# Patient Record
Sex: Female | Born: 1985 | Race: White | Hispanic: No | Marital: Single | Smoking: Never smoker
Health system: Southern US, Community
[De-identification: ages and names within clinical notes are randomized; demographics above are authoritative.]

## PROBLEM LIST (undated history)

## (undated) DIAGNOSIS — Q899 Congenital malformation, unspecified: Secondary | ICD-10-CM

## (undated) DIAGNOSIS — N186 End stage renal disease: Secondary | ICD-10-CM

## (undated) DIAGNOSIS — Z8619 Personal history of other infectious and parasitic diseases: Secondary | ICD-10-CM

## (undated) DIAGNOSIS — I10 Essential (primary) hypertension: Secondary | ICD-10-CM

## (undated) DIAGNOSIS — Z992 Dependence on renal dialysis: Secondary | ICD-10-CM

## (undated) DIAGNOSIS — Z87898 Personal history of other specified conditions: Secondary | ICD-10-CM

## (undated) DIAGNOSIS — Q613 Polycystic kidney, unspecified: Secondary | ICD-10-CM

## (undated) HISTORY — PX: CLEFT PALATE REPAIR: SUR1165

## (undated) HISTORY — DX: Essential (primary) hypertension: I10

## (undated) HISTORY — DX: End stage renal disease: N18.6

## (undated) HISTORY — PX: KIDNEY TRANSPLANT: SHX239

## (undated) HISTORY — DX: Personal history of other specified conditions: Z87.898

## (undated) HISTORY — DX: End stage renal disease: Z99.2

## (undated) HISTORY — DX: Personal history of other infectious and parasitic diseases: Z86.19

## (undated) HISTORY — PX: CENTRAL VENOUS CATHETER INSERTION: SHX401

---

## 2011-05-14 ENCOUNTER — Emergency Department (HOSPITAL_COMMUNITY): Payer: PRIVATE HEALTH INSURANCE

## 2011-05-14 ENCOUNTER — Encounter: Payer: Self-pay | Admitting: *Deleted

## 2011-05-14 ENCOUNTER — Inpatient Hospital Stay (HOSPITAL_COMMUNITY)
Admission: EM | Admit: 2011-05-14 | Discharge: 2011-05-16 | DRG: 438 | Disposition: A | Discharge status: Home / Self Care | Payer: PRIVATE HEALTH INSURANCE | Attending: Internal Medicine | Admitting: Internal Medicine

## 2011-05-14 DIAGNOSIS — I120 Hypertensive chronic kidney disease with stage 5 chronic kidney disease or end stage renal disease: Secondary | ICD-10-CM | POA: Diagnosis present

## 2011-05-14 DIAGNOSIS — K861 Other chronic pancreatitis: Secondary | ICD-10-CM | POA: Diagnosis present

## 2011-05-14 DIAGNOSIS — N186 End stage renal disease: Secondary | ICD-10-CM | POA: Diagnosis present

## 2011-05-14 DIAGNOSIS — Q613 Polycystic kidney, unspecified: Secondary | ICD-10-CM

## 2011-05-14 DIAGNOSIS — D649 Anemia, unspecified: Secondary | ICD-10-CM

## 2011-05-14 DIAGNOSIS — R509 Fever, unspecified: Secondary | ICD-10-CM | POA: Diagnosis present

## 2011-05-14 DIAGNOSIS — Z992 Dependence on renal dialysis: Secondary | ICD-10-CM

## 2011-05-14 DIAGNOSIS — M545 Low back pain, unspecified: Secondary | ICD-10-CM | POA: Diagnosis present

## 2011-05-14 DIAGNOSIS — N179 Acute kidney failure, unspecified: Secondary | ICD-10-CM

## 2011-05-14 DIAGNOSIS — G8929 Other chronic pain: Secondary | ICD-10-CM | POA: Diagnosis present

## 2011-05-14 DIAGNOSIS — M549 Dorsalgia, unspecified: Secondary | ICD-10-CM | POA: Diagnosis present

## 2011-05-14 DIAGNOSIS — D696 Thrombocytopenia, unspecified: Secondary | ICD-10-CM

## 2011-05-14 DIAGNOSIS — Q6119 Other polycystic kidney, infantile type: Secondary | ICD-10-CM

## 2011-05-14 DIAGNOSIS — K859 Acute pancreatitis without necrosis or infection, unspecified: Principal | ICD-10-CM | POA: Diagnosis present

## 2011-05-14 DIAGNOSIS — I10 Essential (primary) hypertension: Secondary | ICD-10-CM | POA: Diagnosis present

## 2011-05-14 HISTORY — DX: Congenital malformation, unspecified: Q89.9

## 2011-05-14 HISTORY — DX: Polycystic kidney, unspecified: Q61.3

## 2011-05-14 LAB — URINE MICROSCOPIC-ADD ON

## 2011-05-14 LAB — COMPREHENSIVE METABOLIC PANEL
AST: 106 U/L — ABNORMAL HIGH (ref 0–37)
Albumin: 2.9 g/dL — ABNORMAL LOW (ref 3.5–5.2)
BUN: 10 mg/dL (ref 6–23)
Calcium: 9.7 mg/dL (ref 8.4–10.5)
Chloride: 107 mEq/L (ref 96–112)
Creatinine, Ser: 4.14 mg/dL — ABNORMAL HIGH (ref 0.50–1.10)
Total Bilirubin: 0.4 mg/dL (ref 0.3–1.2)
Total Protein: 6.2 g/dL (ref 6.0–8.3)

## 2011-05-14 LAB — DIFFERENTIAL
Basophils Absolute: 0 10*3/uL (ref 0.0–0.1)
Basophils Relative: 0 % (ref 0–1)
Eosinophils Absolute: 0.1 10*3/uL (ref 0.0–0.7)
Eosinophils Relative: 1 % (ref 0–5)
Monocytes Absolute: 0.8 10*3/uL (ref 0.1–1.0)
Monocytes Relative: 7 % (ref 3–12)
Neutro Abs: 9.5 10*3/uL — ABNORMAL HIGH (ref 1.7–7.7)

## 2011-05-14 LAB — URINALYSIS, ROUTINE W REFLEX MICROSCOPIC
Bilirubin Urine: NEGATIVE
Leukocytes, UA: NEGATIVE
Nitrite: NEGATIVE
Specific Gravity, Urine: 1.015 (ref 1.005–1.030)
Urobilinogen, UA: 0.2 mg/dL (ref 0.0–1.0)
pH: 7 (ref 5.0–8.0)

## 2011-05-14 LAB — LACTIC ACID, PLASMA: Lactic Acid, Venous: 1.3 mmol/L (ref 0.5–2.2)

## 2011-05-14 LAB — CBC
HCT: 28.7 % — ABNORMAL LOW (ref 36.0–46.0)
Hemoglobin: 9.5 g/dL — ABNORMAL LOW (ref 12.0–15.0)
MCH: 30.3 pg (ref 26.0–34.0)
MCHC: 33.1 g/dL (ref 30.0–36.0)
RDW: 15.5 % (ref 11.5–15.5)

## 2011-05-14 LAB — LIPASE, BLOOD: Lipase: 57 U/L (ref 11–59)

## 2011-05-14 MED ORDER — ACETAMINOPHEN 650 MG RE SUPP: 650.0000 mg | Freq: Four times a day (QID) | RECTAL | Status: DC | PRN

## 2011-05-14 MED ORDER — AMLODIPINE BESYLATE 5 MG PO TABS
10.0000 mg | ORAL_TABLET | Freq: Every day | ORAL | Status: DC
  Administered 2011-05-15 – 2011-05-16 (×2): 10 mg via ORAL
  Filled 2011-05-14: qty 1
  Filled 2011-05-14: qty 2
  Filled 2011-05-14: qty 1

## 2011-05-14 MED ORDER — ACETAMINOPHEN 500 MG PO TABS
1000.0000 mg | ORAL_TABLET | Freq: Once | ORAL | Status: AC
  Administered 2011-05-14: 1000 mg via ORAL
  Filled 2011-05-14: qty 2

## 2011-05-14 MED ORDER — ENOXAPARIN SODIUM 30 MG/0.3ML ~~LOC~~ SOLN
30.0000 mg | SUBCUTANEOUS | Status: DC
  Administered 2011-05-14 – 2011-05-15 (×2): 30 mg via SUBCUTANEOUS
  Filled 2011-05-14 (×2): qty 0.3

## 2011-05-14 MED ORDER — FUROSEMIDE 80 MG PO TABS
80.0000 mg | ORAL_TABLET | Freq: Two times a day (BID) | ORAL | Status: DC
  Administered 2011-05-15 – 2011-05-16 (×3): 80 mg via ORAL
  Filled 2011-05-14 (×4): qty 1

## 2011-05-14 MED ORDER — FENTANYL CITRATE 0.05 MG/ML IJ SOLN
12.5000 ug | INTRAMUSCULAR | Status: DC | PRN
  Filled 2011-05-14: qty 2

## 2011-05-14 MED ORDER — ACETAMINOPHEN 325 MG PO TABS: 650.0000 mg | ORAL_TABLET | Freq: Four times a day (QID) | ORAL | Status: DC | PRN

## 2011-05-14 MED ORDER — ONDANSETRON HCL 4 MG/2ML IJ SOLN: 4.0000 mg | Freq: Four times a day (QID) | INTRAMUSCULAR | Status: DC | PRN

## 2011-05-14 MED ORDER — HYDROCODONE-ACETAMINOPHEN 5-325 MG PO TABS
1.0000 | ORAL_TABLET | ORAL | Status: DC | PRN
  Administered 2011-05-14 – 2011-05-15 (×4): 1 via ORAL
  Filled 2011-05-14 (×4): qty 1

## 2011-05-14 MED ORDER — PANTOPRAZOLE SODIUM 40 MG PO TBEC
40.0000 mg | DELAYED_RELEASE_TABLET | Freq: Every day | ORAL | Status: DC
  Administered 2011-05-14 – 2011-05-15 (×3): 40 mg via ORAL
  Filled 2011-05-14 (×2): qty 1

## 2011-05-14 MED ORDER — SODIUM CHLORIDE 0.9 % IV SOLN
INTRAVENOUS | Status: DC
  Administered 2011-05-14: 19:00:00 via INTRAVENOUS

## 2011-05-14 MED ORDER — ONDANSETRON HCL 4 MG PO TABS: 4.0000 mg | ORAL_TABLET | Freq: Four times a day (QID) | ORAL | Status: DC | PRN

## 2011-05-14 MED ORDER — ALUM & MAG HYDROXIDE-SIMETH 200-200-20 MG/5ML PO SUSP: 30.0000 mL | Freq: Four times a day (QID) | ORAL | Status: DC | PRN

## 2011-05-14 NOTE — ED Provider Notes (Signed)
History     CSN: [id number] Arrival date & time: 05/14/2011  5:17 PM   First MD Initiated Contact with Patient 05/14/11 1730      Chief Complaint  Patient presents with  . Back Pain    HPI Pt was seen at 1735.  Per pt and parents, c/o gradual onset and persistence of return of low back "pain" and "fever" x4 days.  Pt was recently admitted to [facility] for 10 days (discharged on 05/08/11) for same complaint (as well as acute renal failure) and are unsure what the cause was for her symptoms at that time.  States she has been well up until 4 days ago.  Temp on Monday during HD was "low grade," and was given IV vancomycin during that HD treatment.  Pt took a nap today and woke up with "101" fever and low back pain PTA.  Denies CP/SOB, no cough, no rash, no abd pain, no N/V/D.   PMD:  [facility] Renal:  Dr. Kristian Covey Past Medical History  Diagnosis Date  . Renal failure   . Polycystic kidney disease   . Chronic renal failure   . Hemodialysis status   . Anemia   . Congenital abnormalities     Orofacial digital syndrome    Past Surgical History  Procedure Date  . Central venous catheter insertion     left chest  . Cleft palate repair     Social History  . Marital Status: Single   Social History Main Topics  . Smoking status: Never Smoker   . Smokeless tobacco: None  . Alcohol Use: No  . Drug Use: No        Review of Systems ROS: Statement: All systems negative except as marked or noted in the HPI; Constitutional: Positive for fever and chills. ; ; Eyes: Negative for eye pain, redness and discharge. ; ; ENMT: Negative for ear pain, hoarseness, nasal congestion, sinus pressure and sore throat. ; ; Cardiovascular: Negative for chest pain, palpitations, diaphoresis, dyspnea and peripheral edema. ; ; Respiratory: Negative for cough, wheezing and stridor. ; ; Gastrointestinal: Negative for nausea, vomiting, diarrhea and abdominal pain, blood in stool, hematemesis, jaundice  and rectal bleeding. . ; ; Genitourinary: Negative for dysuria, flank pain and hematuria. ; ; Musculoskeletal: +LBP.  Negative for neck pain. Negative for swelling and trauma.; ; Skin: Negative for pruritus, rash, abrasions, blisters, bruising and skin lesion.; ; Neuro: Negative for headache, lightheadedness and neck stiffness. Negative for weakness, altered level of consciousness , altered mental status, extremity weakness, paresthesias, involuntary movement, seizure and syncope.     Allergies  Review of patient's allergies indicates no known allergies.  Home Medications   Current Outpatient Rx  Name Route Sig Dispense Refill  . AMLODIPINE BESYLATE 10 MG PO TABS Oral Take 10 mg by mouth daily.      . FUROSEMIDE 80 MG PO TABS Oral Take 80 mg by mouth 2 (two) times daily.      Marland Kitchen HYDROCODONE-ACETAMINOPHEN 5-500 MG PO TABS Oral Take 1 tablet by mouth as needed. For pain     . OMEPRAZOLE 20 MG PO CPDR Oral Take 20 mg by mouth daily.      Marland Kitchen PROBIOTIC FORMULA PO Oral Take 1 tablet by mouth daily.        BP 139/98  Pulse 127  Temp(Src) 101.7 F (38.7 C) (Rectal)  Resp 20  Ht 5\' 4"  (1.626 m)  Wt 157 lb (71.215 kg)  BMI 26.95 kg/m2  SpO2  100%  LMP 04/23/2011 Patient Vitals for the past 24 hrs:  BP Temp Temp src Pulse Resp SpO2 Height Weight  05/14/11 2044 133/83 mmHg - - 86  20  97 % - -  05/14/11 1836 139/98 mmHg - - 127  - - - -  05/14/11 1835 127/87 mmHg - - 113  - - - -  05/14/11 1833 138/81 mmHg - - 103  - - - -  05/14/11 1753 - 101.7 F (38.7 C) Rectal - - - - -  05/14/11 1746 - 99.8 F (37.7 C) Oral - - - - -  05/14/11 1708 141/95 mmHg 99.6 F (37.6 C) Oral 133  20  100 % 5\' 4"  (1.626 m) 157 lb (71.215 kg)    Physical Exam 1740: Physical examination:  Nursing notes reviewed; Vital signs and O2 SAT reviewed; +febrile.  Constitutional: Well developed, Well nourished, In no acute distress; Head:  Normocephalic, atraumatic; Eyes: EOMI, PERRL, No scleral icterus; ENMT: Mouth and  pharynx normal, Mucous membranes dry; Neck: Supple, Full range of motion, No lymphadenopathy; Cardiovascular: +tachycardia, No murmur, rub, or gallop; Respiratory: Breath sounds clear & equal bilaterally, No rales, rhonchi, wheezes, or rub, Normal respiratory effort/excursion; Chest: Nontender, Movement normal, +HD cath left upper chest; Abdomen: Soft, Nontender, Nondistended, Normal bowel sounds; Genitourinary: +bilat CVA tenderness; Spine:  No midline CS, TS, LS tenderness. +TTP L>R lumbar paraspinal muscles. Extremities: Pulses normal, No tenderness, No edema, No calf edema or asymmetry.; Neuro: AA&Ox3, Major CN grossly intact.  No gross focal motor or sensory deficits in extremities.; Skin: Color normal, Warm, Dry, no rash, no petechiae.    ED Course  Procedures 1745:  Rectal temp 101.7.  Will dose tylenol.  Unit Secretary to obtain previous admit records from [place].   MDM  MDM Reviewed: nursing note, vitals and previous chart Reviewed previous: labs and CT scan Interpretation: labs and x-ray   Results for orders placed during the hospital encounter of 05/14/11  CBC      Component Value Range   WBC 11.5 (*) 4.0 - 10.5 (K/uL)   RBC 3.14 (*) 3.87 - 5.11 (MIL/uL)   Hemoglobin 9.5 (*) 12.0 - 15.0 (g/dL)   HCT 40.9 (*) 81.1 - 46.0 (%)   MCV 91.4  78.0 - 100.0 (fL)   MCH 30.3  26.0 - 34.0 (pg)   MCHC 33.1  30.0 - 36.0 (g/dL)   RDW 91.4  78.2 - 95.6 (%)   Platelets 254  150 - 400 (K/uL)  DIFFERENTIAL      Component Value Range   Neutrophils Relative 83 (*) 43 - 77 (%)   Neutro Abs 9.5 (*) 1.7 - 7.7 (K/uL)   Lymphocytes Relative 9 (*) 12 - 46 (%)   Lymphs Abs 1.0  0.7 - 4.0 (K/uL)   Monocytes Relative 7  3 - 12 (%)   Monocytes Absolute 0.8  0.1 - 1.0 (K/uL)   Eosinophils Relative 1  0 - 5 (%)   Eosinophils Absolute 0.1  0.0 - 0.7 (K/uL)   Basophils Relative 0  0 - 1 (%)   Basophils Absolute 0.0  0.0 - 0.1 (K/uL)  COMPREHENSIVE METABOLIC PANEL      Component Value Range    Sodium 142  135 - 145 (mEq/L)   Potassium 4.7  3.5 - 5.1 (mEq/L)   Chloride 107  96 - 112 (mEq/L)   CO2 27  19 - 32 (mEq/L)   Glucose, Bld 95  70 - 99 (mg/dL)   BUN 10  6 - 23 (mg/dL)   Creatinine, Ser 0.45 (*) 0.50 - 1.10 (mg/dL)   Calcium 9.7  8.4 - 40.9 (mg/dL)   Total Protein 6.2  6.0 - 8.3 (g/dL)   Albumin 2.9 (*) 3.5 - 5.2 (g/dL)   AST 811 (*) 0 - 37 (U/L)   ALT 59 (*) 0 - 35 (U/L)   Alkaline Phosphatase 127 (*) 39 - 117 (U/L)   Total Bilirubin 0.4  0.3 - 1.2 (mg/dL)   GFR calc non Af Amer 14 (*) >90 (mL/min)   GFR calc Af Amer 16 (*) >90 (mL/min)  LACTIC ACID, PLASMA      Component Value Range   Lactic Acid, Venous 1.3  0.5 - 2.2 (mmol/L)  LIPASE, BLOOD      Component Value Range   Lipase 57  11 - 59 (U/L)  URINALYSIS, ROUTINE W REFLEX MICROSCOPIC      Component Value Range   Color, Urine YELLOW  YELLOW    Appearance CLEAR  CLEAR    Specific Gravity, Urine 1.015  1.005 - 1.030    pH 7.0  5.0 - 8.0    Glucose, UA 100 (*) NEGATIVE (mg/dL)   Hgb urine dipstick SMALL (*) NEGATIVE    Bilirubin Urine NEGATIVE  NEGATIVE    Ketones, ur NEGATIVE  NEGATIVE (mg/dL)   Protein, ur TRACE (*) NEGATIVE (mg/dL)   Urobilinogen, UA 0.2  0.0 - 1.0 (mg/dL)   Nitrite NEGATIVE  NEGATIVE    Leukocytes, UA NEGATIVE  NEGATIVE   URINE MICROSCOPIC-ADD ON      Component Value Range   Squamous Epithelial / LPF FEW (*) RARE    WBC, UA 3-6  <3 (WBC/hpf)   RBC / HPF 3-6  <3 (RBC/hpf)   Bacteria, UA RARE  RARE    Dg Chest 2 View  05/14/2011  *RADIOLOGY REPORT*  Clinical Data: Fever.  CHEST - 2 VIEW  Comparison: None.  Findings: Left dialysis catheter is in place.  The distal tip was directed laterally into the lateral wall of the upper SVC.  Heart is normal size.  Lungs are clear.  No confluent opacity or effusion.  No acute bony abnormality.  IMPRESSION: No active cardiopulmonary disease.  Original Report Authenticated By: Cyndie Chime, M.D.   7:54 PM:  No previous records/labs in [place].   [place] records obtained.  LFT's elevated today; previous LFT's on 04/24/11 at [facility] with AST 6, ALT 8, alkphos 39.  Cr elevated per known hx of CRF on HD.  H/H with known anemia; previous labs on 04/29/11 at [facility] H/H 8.3, 24.3.  No clear cut source of fever at this time.  UC and BC x2 pending.  Will check CT A/P without contrast d/t renal failure.     Ct Abdomen Pelvis Wo Contrast  05/14/2011  *RADIOLOGY REPORT*  Clinical Data: Back pain, fever.  CT ABDOMEN AND PELVIS WITHOUT CONTRAST  Technique:  Multidetector CT imaging of the abdomen and pelvis was performed following the standard protocol without intravenous contrast.  Comparison: 04/21/2011  Findings: Dependent atelectasis in the lung bases.  Trace pleural effusions.  Heart is upper limits normal in size.  Periportal areas of low density again noted as seen on prior study, possibly periportal edema.  This could also represent intrahepatic biliary ductal dilatation, but it is difficult to determine without IV contrast.  Spleen is upper limits normal in size.  No definite focal lesion.  Pancreatic calcifications are noted compatible with chronic pancreatitis.  There is a small  amount of stranding adjacent to the pancreatic tail and extending into the left anterior pararenal space.  There is also stranding in the right anterior pararenal space.  Findings may reflect early or mild changes of acute pancreatitis.  Recommend clinical correlation. Small amount of free fluid is seen in the pelvis.  Uterus and adnexa are unremarkable.  Large and small bowel grossly unremarkable.  No acute bony abnormality.  IMPRESSION: Changes of chronic pancreatitis with calcifications present.  There is stranding near the pancreas and extending in the anterior pararenal spaces bilaterally suggesting the possibility of acute pancreatitis.  Question periportal edema versus intrahepatic biliary ductal dilatation.  It is difficult to differentiate without IV contrast.   Bibasilar atelectasis, trace effusions.  Original Report Authenticated By: Cyndie Chime, M.D.   9:07 PM:  Pt's previous CT A/P without contrast on 04/21/11 per [facility] records similar to above, though today's CT with new stranding near the pancreas raising the possibility of acute on chronic pancreatitis.  Abd continues benign on exam, only c/o back pain and "I'm hungry."  No N/V while in ED, VSS, tachycardia improving as fever improves.  Pain improved after meds.  Dx testing d/w pt and family.  Questions answered.  Verb understanding, agreeable to admit.   9:26 PM:  T/C to [facility] Dr. Adela Glimpse, case discussed, including:  HPI, pertinent PM/SHx, VS/PE, dx testing, ED course and treatment.  Agreeable to admit.  Will eval pt in ED.     [facility]    Laray Anger, DO 05/15/11 2107

## 2011-05-14 NOTE — ED Notes (Signed)
Family at bedside. Patient is uncomfortable and stating she is cold. Patients temp is 101.7 so only a sheet was given. Patient was taken to X-ray.

## 2011-05-14 NOTE — H&P (Signed)
Monica Guerra MRN: [id number] DOB/AGE: 1986-03-12 25 y.o. Primary Care Physician:GOLDING,JOHN CABOT, MD Admit date: 05/14/2011 Chief Complaint: This is a 25 year old female who presents to the ER with a chief complaint of fever and chills patient was found to have a temperature of 101.7 in the ER. The patient was recently admitted and [facility] between the 10th and October 20 with acute renal failure. Her acute renal failure with a presenting creatinine of 8.78 was thought to be secondary to polycystic kidney disease wasn't diabetic nephropathy. The patient was initiated on hemodialysis on Monday Wednesday Friday via a dialysis catheter. The patient reports that she has had chronic low back pain for the last 3 months. Over the course of the last week or so the pain has been particularly worse. She states that she received a dose of vancomycin with her hemodialysis session on Monday. After receiving the vancomycin dose she developed some diarrhea and GI distress. She's been having diarrhea 5-6 episodes for the last 3-4 days. She denies any fever chills riders at home and does complain of some night sweats. She is being admitted for fever possibility of a psoas abscess versus discitis versus epidural abscess. HPI:   Past Medical History  Diagnosis Date  . Renal failure   . Polycystic kidney disease   . Chronic renal failure   . Hemodialysis status   . Anemia   . Congenital abnormalities     Orofacial digital syndrome    Past Surgical History  Procedure Date  . Central venous catheter insertion     left chest  . Cleft palate repair     Prior to Admission medications   Medication Sig Start Date End Date Taking? Authorizing Provider  amLODipine (NORVASC) 10 MG tablet Take 10 mg by mouth daily.     Yes Historical Provider, MD  furosemide (LASIX) 80 MG tablet Take 80 mg by mouth 2 (two) times daily.     Yes Historical Provider, MD  HYDROcodone-acetaminophen (VICODIN) 5-500 MG  per tablet Take 1 tablet by mouth as needed. For pain    Yes Historical Provider, MD  omeprazole (PRILOSEC) 20 MG capsule Take 20 mg by mouth daily.     Yes Historical Provider, MD  Probiotic Product (PROBIOTIC FORMULA PO) Take 1 tablet by mouth daily.     Yes Historical Provider, MD    Allergies: No Known Allergies  History reviewed. No pertinent family history.  Social History:  reports that she has never smoked. She does not have any smokeless tobacco history on file. She reports that she does not drink alcohol or use illicit drugs.  ROS: A 14 point review of systems was done and was found to be positive for low back pain some nausea and diarrhea for the last one week.  PHYSICAL EXAM: Blood pressure 133/83, pulse 86, temperature 99.2 F (37.3 C), temperature source Oral, resp. rate 20, height 5\' 4"  (1.626 m), weight 71.215 kg (157 lb), last menstrual period 04/23/2011, SpO2 97.00%. MAXIMUM TEMPERATURE of 101.7   HEENT. Sclerae anicteric oral mucosa is dry, neck is supple no JVD Lungs. Clear to auscultation bilaterally no wheezes no crackles or rhonchi Cardiovascular. Regular rate and rhythm no murmurs rubs or gallops Abdomen soft nontender nondistended, normoactive bowel sounds, nontender to palpation Skin. Without any skin rashes Neurologic. No gross focal neurologic deficits, cranial nerves 2-12 grossly intact, no gross motor or sensory deficits found Extremities, trace pedal edema in bilateral lower extremities, she also has some bilateral upper extremity puffiness Psychiatric,  appropriate mood and affect    EKG: normal EKG, normal sinus rhythm, unchanged from previous tracings, there are no previous tracings available for comparison.  No results found for this or any previous visit (from the past 240 hour(s)).   Results for orders placed during the hospital encounter of 05/14/11 (from the past 48 hour(s))  CBC     Status: Abnormal   Collection Time   05/14/11  6:30 PM       Component Value Range Comment   WBC 11.5 (*) 4.0 - 10.5 (K/uL)    RBC 3.14 (*) 3.87 - 5.11 (MIL/uL)    Hemoglobin 9.5 (*) 12.0 - 15.0 (g/dL)    HCT 24.4 (*) 01.0 - 46.0 (%)    MCV 91.4  78.0 - 100.0 (fL)    MCH 30.3  26.0 - 34.0 (pg)    MCHC 33.1  30.0 - 36.0 (g/dL)    RDW 27.2  53.6 - 64.4 (%)    Platelets 254  150 - 400 (K/uL)   DIFFERENTIAL     Status: Abnormal   Collection Time   05/14/11  6:30 PM      Component Value Range Comment   Neutrophils Relative 83 (*) 43 - 77 (%)    Neutro Abs 9.5 (*) 1.7 - 7.7 (K/uL)    Lymphocytes Relative 9 (*) 12 - 46 (%)    Lymphs Abs 1.0  0.7 - 4.0 (K/uL)    Monocytes Relative 7  3 - 12 (%)    Monocytes Absolute 0.8  0.1 - 1.0 (K/uL)    Eosinophils Relative 1  0 - 5 (%)    Eosinophils Absolute 0.1  0.0 - 0.7 (K/uL)    Basophils Relative 0  0 - 1 (%)    Basophils Absolute 0.0  0.0 - 0.1 (K/uL)   COMPREHENSIVE METABOLIC PANEL     Status: Abnormal   Collection Time   05/14/11  6:30 PM      Component Value Range Comment   Sodium 142  135 - 145 (mEq/L)    Potassium 4.7  3.5 - 5.1 (mEq/L)    Chloride 107  96 - 112 (mEq/L)    CO2 27  19 - 32 (mEq/L)    Glucose, Bld 95  70 - 99 (mg/dL)    BUN 10  6 - 23 (mg/dL)    Creatinine, Ser 0.34 (*) 0.50 - 1.10 (mg/dL)    Calcium 9.7  8.4 - 10.5 (mg/dL)    Total Protein 6.2  6.0 - 8.3 (g/dL)    Albumin 2.9 (*) 3.5 - 5.2 (g/dL)    AST 742 (*) 0 - 37 (U/L) SLIGHT HEMOLYSIS   ALT 59 (*) 0 - 35 (U/L)    Alkaline Phosphatase 127 (*) 39 - 117 (U/L)    Total Bilirubin 0.4  0.3 - 1.2 (mg/dL)    GFR calc non Af Amer 14 (*) >90 (mL/min)    GFR calc Af Amer 16 (*) >90 (mL/min)   LACTIC ACID, PLASMA     Status: Normal   Collection Time   05/14/11  6:30 PM      Component Value Range Comment   Lactic Acid, Venous 1.3  0.5 - 2.2 (mmol/L)   LIPASE, BLOOD     Status: Normal   Collection Time   05/14/11  6:30 PM      Component Value Range Comment   Lipase 57  11 - 59 (U/L)   URINALYSIS, ROUTINE W REFLEX MICROSCOPIC  Status: Abnormal   Collection Time   05/14/11  6:43 PM      Component Value Range Comment   Color, Urine YELLOW  YELLOW     Appearance CLEAR  CLEAR     Specific Gravity, Urine 1.015  1.005 - 1.030     pH 7.0  5.0 - 8.0     Glucose, UA 100 (*) NEGATIVE (mg/dL)    Hgb urine dipstick SMALL (*) NEGATIVE     Bilirubin Urine NEGATIVE  NEGATIVE     Ketones, ur NEGATIVE  NEGATIVE (mg/dL)    Protein, ur TRACE (*) NEGATIVE (mg/dL)    Urobilinogen, UA 0.2  0.0 - 1.0 (mg/dL)    Nitrite NEGATIVE  NEGATIVE     Leukocytes, UA NEGATIVE  NEGATIVE    URINE MICROSCOPIC-ADD ON     Status: Abnormal   Collection Time   05/14/11  6:43 PM      Component Value Range Comment   Squamous Epithelial / LPF FEW (*) RARE     WBC, UA 3-6  <3 (WBC/hpf)    RBC / HPF 3-6  <3 (RBC/hpf)    Bacteria, UA RARE  RARE      Ct Abdomen Pelvis Wo Contrast  05/14/2011   IMPRESSION: Changes of chronic pancreatitis with calcifications present.  There is stranding near the pancreas and extending in the anterior pararenal spaces bilaterally suggesting the possibility of acute pancreatitis.  Question periportal edema versus intrahepatic biliary ductal dilatation.  It is difficult to differentiate without IV contrast.  Bibasilar atelectasis, trace effusions.  Original Report Authenticated By: Cyndie Chime, M.D.   Dg Chest 2 View  05/14/2011  *RADIOLOGY REPORT*  Clinical Data: Fever.  CHEST - 2 VIEW  Comparison: None.  Findings: Left dialysis catheter is in place.  The distal tip was directed laterally into the lateral wall of the upper SVC.  Heart is normal size.  Lungs are clear.  No confluent opacity or effusion.  No acute bony abnormality.  IMPRESSION: No active cardiopulmonary disease.  Original Report Authenticated By: Cyndie Chime, M.D.    Impression:  Principal Problem:  #1 Back pain, acute on chronic: This appears to be chronic and has been present for the last 3 months, however in the setting of for fever, worsening of  pain in the last week or so, as well as abscess versus discitis versus epidural abscess needs to be ruled out. The patient would need an MRI of the lumbar spine without contrast in the morning. We will empirically cover her with vancomycin, no blood cultures    #2 Renal failure, acute on chronic: Patient is currently receiving hemodialysis on Monday Wednesday Friday via a left subclavian hemodialysis catheter. The catheter site appears to be within normal limits without any evidence of erythema or induration. I do not feel that the catheter is a source of infection  . Nephrology will be consulted in the morning.  #3 Fever and chills: A urinalysis and chest x-ray are negative. Blood cultures have been drawn in the ER. Because of concern about the possibility of an epidural abscess and back pain, the patient has been initiated on vancomycin.  #4 patient has anemia of chronic disease secondary to her renal failure. During her previous hospitalization she developed thrombocytopenia. A hematology consultation was obtained for her Thrombocytopenia. Dr. Isabel Caprice  saw the patient [facility]. Her thrombocytopenia was attributed to Ultram. Most of her episodes of heart to be drug related.  CKD (chronic kidney disease),  stage V  Polycystic kidney disease, congenital  HTN (hypertension): The patient is on Norvasc which will be continued.  The plan of care was discussed with the patient and her mother is currently present at the bedside.         Monica Guerra 05/14/2011, 10:59 PM

## 2011-05-14 NOTE — ED Notes (Signed)
rn in to recheck temp, pt reports she just drank a sip of juice, pt advised no to drink and temp will be taken in a few min

## 2011-05-14 NOTE — ED Notes (Signed)
Patient in a lot of pain right now.

## 2011-05-14 NOTE — ED Notes (Signed)
Pt states she was admitted to [facility] for aprox 10 days and discharged on October 27th. Pt states she has had a fever and severe back pain since. Pt states she had dialysis this am.

## 2011-05-15 ENCOUNTER — Inpatient Hospital Stay (HOSPITAL_COMMUNITY): Payer: PRIVATE HEALTH INSURANCE

## 2011-05-15 ENCOUNTER — Ambulatory Visit (HOSPITAL_COMMUNITY): Payer: PRIVATE HEALTH INSURANCE

## 2011-05-15 DIAGNOSIS — R509 Fever, unspecified: Secondary | ICD-10-CM | POA: Insufficient documentation

## 2011-05-15 DIAGNOSIS — Q619 Cystic kidney disease, unspecified: Secondary | ICD-10-CM | POA: Insufficient documentation

## 2011-05-15 DIAGNOSIS — M549 Dorsalgia, unspecified: Secondary | ICD-10-CM | POA: Insufficient documentation

## 2011-05-15 DIAGNOSIS — K861 Other chronic pancreatitis: Secondary | ICD-10-CM | POA: Diagnosis present

## 2011-05-15 LAB — COMPREHENSIVE METABOLIC PANEL
ALT: 51 U/L — ABNORMAL HIGH (ref 0–35)
AST: 74 U/L — ABNORMAL HIGH (ref 0–37)
Alkaline Phosphatase: 100 U/L (ref 39–117)
CO2: 26 mEq/L (ref 19–32)
Chloride: 106 mEq/L (ref 96–112)
GFR calc non Af Amer: 10 mL/min — ABNORMAL LOW (ref >90)
Potassium: 4.5 mEq/L (ref 3.5–5.1)
Sodium: 139 mEq/L (ref 135–145)
Total Bilirubin: 0.5 mg/dL (ref 0.3–1.2)

## 2011-05-15 LAB — C-REACTIVE PROTEIN: CRP: 5.17 mg/dL — ABNORMAL HIGH (ref <0.60)

## 2011-05-15 LAB — HEMOGLOBIN A1C: Hgb A1c MFr Bld: 5.2 % (ref <5.7)

## 2011-05-15 LAB — URINE CULTURE: Culture  Setup Time: 201211030050

## 2011-05-15 LAB — CBC
HCT: 25.2 % — ABNORMAL LOW (ref 36.0–46.0)
MCHC: 32.5 g/dL (ref 30.0–36.0)
MCV: 92.3 fL (ref 78.0–100.0)
RDW: 15.7 % — ABNORMAL HIGH (ref 11.5–15.5)

## 2011-05-15 LAB — URINALYSIS, ROUTINE W REFLEX MICROSCOPIC
Glucose, UA: 100 mg/dL — AB
Ketones, ur: NEGATIVE mg/dL
pH: 8 (ref 5.0–8.0)

## 2011-05-15 LAB — URINE MICROSCOPIC-ADD ON

## 2011-05-15 LAB — MRSA PCR SCREENING: MRSA by PCR: NEGATIVE

## 2011-05-15 LAB — SEDIMENTATION RATE: Sed Rate: 39 mm/hr — ABNORMAL HIGH (ref 0–22)

## 2011-05-15 LAB — CLOSTRIDIUM DIFFICILE BY PCR: Toxigenic C. Difficile by PCR: NEGATIVE

## 2011-05-15 MED ORDER — DIPHENHYDRAMINE HCL 25 MG PO CAPS
25.0000 mg | ORAL_CAPSULE | Freq: Four times a day (QID) | ORAL | Status: DC | PRN
  Administered 2011-05-15: 25 mg via ORAL
  Filled 2011-05-15: qty 1

## 2011-05-15 MED ORDER — VANCOMYCIN HCL IN DEXTROSE 1-5 GM/200ML-% IV SOLN
INTRAVENOUS | Status: AC
  Filled 2011-05-15: qty 200

## 2011-05-15 MED ORDER — VANCOMYCIN HCL IN DEXTROSE 1-5 GM/200ML-% IV SOLN
1000.0000 mg | Freq: Once | INTRAVENOUS | Status: AC
  Administered 2011-05-15: 1000 mg via INTRAVENOUS
  Filled 2011-05-15: qty 200

## 2011-05-15 NOTE — Progress Notes (Signed)
Chart reviewed. Records from [facility] reviewed.  Subjective: Back pain is improved. Poor appetite today. Had severe epigastric pain last week. No vomiting. No known previous history of pancreatitis. Denies history of heavy alcohol use. No known history of hypertriglyceridemia. Prior to a month ago, she had not been on chronic medications.  Objective: Vital signs in last 24 hours: Filed Vitals:   05/14/11 2225 05/14/11 2312 05/14/11 2342 05/15/11 0453  BP:  131/91 129/80 133/79  Pulse:  87 94 89  Temp: 99.2 F (37.3 C) 99 F (37.2 C) 100 F (37.8 C) 98.7 F (37.1 C)  TempSrc: Oral Oral Oral Oral  Resp:   18 16  Height:   5\' 4"  (1.626 m)   Weight:   72 kg (158 lb 11.7 oz) 73.074 kg (161 lb 1.6 oz)  SpO2:  97% 96% 97%   Weight change:   Intake/Output Summary (Last 24 hours) at 05/15/11 1416 Last data filed at 05/15/11 1300  Gross per 24 hour  Intake    870 ml  Output    100 ml  Net    770 ml   General: Non-toxic appearing. Comfortable. Able to move about the bed without difficulty. Lungs clear to auscultation bilaterally without wheeze rhonchi or rales  Cardiovascular: Regular rate and rhythm. 2/6 systolic murmur in aortic area Abdomen obese soft nontender nondistended Extremities trace edema Musculoskeletal she does have tenderness over the lumbar spine and laterally to the right  Lab Results: Basic Metabolic Panel:  Lab 05/15/11 1610 05/14/11 1830  NA 139 142  K 4.5 4.7  CL 106 107  CO2 26 27  GLUCOSE 83 95  BUN 14 10  CREATININE 5.51* 4.14*  CALCIUM 9.2 9.7  MG -- 1.7  PHOS -- 1.8*   Liver Function Tests:  Lab 05/15/11 0628 05/14/11 1830  AST 74* 106*  ALT 51* 59*  ALKPHOS 100 127*  BILITOT 0.5 0.4  PROT 5.3* 6.2  ALBUMIN 2.6* 2.9*    Lab 05/14/11 1830  LIPASE 57  AMYLASE --   No results found for this basename: AMMONIA:2 in the last 168 hours CBC:  Lab 05/15/11 0628 05/14/11 1830  WBC 7.9 11.5*  NEUTROABS -- 9.5*  HGB 8.2* 9.5*    HCT 25.2* 28.7*  MCV 92.3 91.4  PLT 205 254   Cardiac Enzymes: No results found for this basename: CKTOTAL:3,CKMB:3,CKMBINDEX:3,TROPONINI:3 in the last 168 hours BNP:  Lab 05/14/11 1830  POCBNP 3231.0*   D-Dimer: No results found for this basename: DDIMER:2 in the last 168 hours CBG: No results found for this basename: GLUCAP:6 in the last 168 hours Hemoglobin A1C: No results found for this basename: HGBA1C in the last 168 hours Fasting Lipid Panel: No results found for this basename: CHOL,HDL,LDLCALC,TRIG,CHOLHDL,LDLDIRECT in the last 960 hours Thyroid Function Tests: No results found for this basename: TSH,T4TOTAL,FREET4,T3FREE,THYROIDAB in the last 168 hours Coagulation: No results found for this basename: INR:4 in the last 168 hours Anemia Panel: No results found for this basename: VITAMINB12,FOLATE,FERRITIN,TIBC,IRON,RETICCTPCT in the last 168 hours  Alcohol Level: No results found for this basename: ETH:2 in the last 168 hours Urinalysis: Negative  Micro Results: Recent Results (from the past 240 hour(s))  CULTURE, BLOOD (ROUTINE X 2)     Status: Normal (Preliminary result)   Collection Time   05/14/11  6:00 PM      Component Value Range Status Comment   Specimen Description BLOOD LEFT ARM   Final    Special Requests BOTTLES DRAWN AEROBIC AND ANAEROBIC  6CC   Final    Culture NO GROWTH 1 DAY   Final    Report Status PENDING   Incomplete   CULTURE, BLOOD (ROUTINE X 2)     Status: Normal (Preliminary result)   Collection Time   05/14/11  6:30 PM      Component Value Range Status Comment   Specimen Description BLOOD RIGHT ARM   Final    Special Requests BOTTLES DRAWN AEROBIC AND ANAEROBIC 6CC   Final    Culture NO GROWTH 1 DAY   Final    Report Status PENDING   Incomplete   MRSA PCR SCREENING     Status: Normal   Collection Time   05/15/11  1:12 AM      Component Value Range Status Comment   MRSA by PCR NEGATIVE  NEGATIVE  Final   CLOSTRIDIUM DIFFICILE BY PCR      Status: Normal   Collection Time   05/15/11  1:15 AM      Component Value Range Status Comment   C difficile by pcr NEGATIVE  NEGATIVE  Final    Studies/Results: Ct Abdomen Pelvis Wo Contrast  05/14/2011  *RADIOLOGY REPORT*  Clinical Data: Back pain, fever.  CT ABDOMEN AND PELVIS WITHOUT CONTRAST  Technique:  Multidetector CT imaging of the abdomen and pelvis was performed following the standard protocol without intravenous contrast.  Comparison: 04/21/2011  Findings: Dependent atelectasis in the lung bases.  Trace pleural effusions.  Heart is upper limits normal in size.  Periportal areas of low density again noted as seen on prior study, possibly periportal edema.  This could also represent intrahepatic biliary ductal dilatation, but it is difficult to determine without IV contrast.  Spleen is upper limits normal in size.  No definite focal lesion.  Pancreatic calcifications are noted compatible with chronic pancreatitis.  There is a small amount of stranding adjacent to the pancreatic tail and extending into the left anterior pararenal space.  There is also stranding in the right anterior pararenal space.  Findings may reflect early or mild changes of acute pancreatitis.  Recommend clinical correlation. Small amount of free fluid is seen in the pelvis.  Uterus and adnexa are unremarkable.  Large and small bowel grossly unremarkable.  No acute bony abnormality.  IMPRESSION: Changes of chronic pancreatitis with calcifications present.  There is stranding near the pancreas and extending in the anterior pararenal spaces bilaterally suggesting the possibility of acute pancreatitis.  Question periportal edema versus intrahepatic biliary ductal dilatation.  It is difficult to differentiate without IV contrast.  Bibasilar atelectasis, trace effusions.  Original Report Authenticated By: Cyndie Chime, M.D.   Dg Chest 2 View  05/14/2011  *RADIOLOGY REPORT*  Clinical Data: Fever.  CHEST - 2 VIEW  Comparison:  None.  Findings: Left dialysis catheter is in place.  The distal tip was directed laterally into the lateral wall of the upper SVC.  Heart is normal size.  Lungs are clear.  No confluent opacity or effusion.  No acute bony abnormality.  IMPRESSION: No active cardiopulmonary disease.  Original Report Authenticated By: Cyndie Chime, M.D.   US Abdomen Complete  05/15/2011  *RADIOLOGY REPORT*  Clinical Data: Pancreatitis.  Evaluate for gallstones. History of congenital polycystic kidney disease with a stage V chronic kidney disease.  ABDOMEN ULTRASOUND  Technique:  Complete abdominal ultrasound examination was performed including evaluation of the liver, gallbladder, bile ducts, pancreas, kidneys, spleen, IVC, and abdominal aorta.  Comparison: CT scan from 05/14/2011.  Findings:  Gallbladder:  Gallbladder wall is not well distended.  No gallstones are evident, but gallbladder wall thickness is at upper normal measuring 3 mm.  There is no pericholecystic fluid.  The sonographer reports no sonographic Murphy's sign.  Common Bile Duct:  Nondilated at 3 mm diameter.  Liver:  The no focal intraparenchymal abnormality.  Main portal vein is prominent.  No substantial intrahepatic biliary duct dilatation.  IVC:  Normal.  Pancreas:  Pancreatic head and tail are poorly visualized secondary to overlying, obscuring bowel gas.  Spleen:  Unremarkable  Right kidney:  9.6 cm in long axis.  Diffusely abnormal appearance with cortical thinning and multiple cystic areas, some of which are relatively well defined and others which are not.  Largest identified cyst measures about 2.4 cm.  Left kidney:  13.7 cm in long axis.  Similar appearance to the contralateral kidney with numerous cystic areas of varying sizes, some of the cystic areas are poorly defined.  Abdominal Aorta:  No aneurysm.  IMPRESSION: Gallbladder is not well distended, but no gallstones are evident. Gallbladder wall thickness is borderline increased without  pericholecystic fluid or sonographic Murphy's sign.  Markedly abnormal appearance to the kidneys with cortical thinning and diffuse cystic change bilaterally.  The the patient has a history of congenital renal cystic disease and chronic renal insufficiency.  Original Report Authenticated By: ERIC A. MANSELL, M.D.   Scheduled Meds:   . acetaminophen  1,000 mg Oral Once  . amLODipine  10 mg Oral Daily  . enoxaparin  30 mg Subcutaneous Q24H  . furosemide  80 mg Oral BID  . pantoprazole  40 mg Oral Q1200  . vancomycin  1,000 mg Intravenous Once   Continuous Infusions:   . DISCONTD: sodium chloride 30 mL/hr at 05/14/11 1830   PRN Meds:.acetaminophen, alum & mag hydroxide-simeth, diphenhydrAMINE, fentaNYL, HYDROcodone-acetaminophen, ondansetron (ZOFRAN) IV, ondansetron, DISCONTD: acetaminophen Assessment/Plan: Principal Problem:  *Back pain, chronic Active Problems:  Fever and chills  ESRD (end stage renal disease) on dialysis  Chronic pancreatitis  Polycystic kidney disease, congenital  HTN (hypertension)  Fever: I agree we need to rule out discitis or osteomyelitis. I will arrange a MRI in [facility] via care link. Check CRP and erythrocyte sedimentation rate. She has had epigastric pain in her fever may be coming from acute on chronic pancreatitis. Check a triglyceride level. With loose stool, her fever also may be related to a viral gastroenteritis. Continue vancomycin.   LOS: 1 day   Bretta Fees L 05/15/2011, 2:16 PM

## 2011-05-15 NOTE — Progress Notes (Signed)
ANTIBIOTIC CONSULT NOTE - INITIAL  Pharmacy Consult for Vancomycin Indication: pneumonia  No Known Allergies  Patient Measurements: Height: 5\' 4"  (162.6 cm) Weight: 161 lb 1.6 oz (73.074 kg) IBW/kg (Calculated) : 54.7     Vital Signs: Temp: 98.7 F (37.1 C) (11/03 0453) Temp src: Oral (11/03 0453) BP: 133/79 mmHg (11/03 0453) Pulse Rate: 89  (11/03 0453) Intake/Output from previous day: 11/02 0701 - 11/03 0700 In: 630 [I.V.:430; IV Piggyback:200] Out: 100 [Urine:100] Intake/Output from this shift:    Labs:  [facility] 05/15/11 0628 05/14/11 1830  WBC 7.9 11.5*  HGB 8.2* 9.5*  PLT 205 254  LABCREA -- --  CREATININE 5.51* 4.14*   Estimated Creatinine Clearance: 15.6 ml/min (by C-G formula based on Cr of 5.51). No results found for this basename: VANCOTROUGH:2,VANCOPEAK:2,VANCORANDOM:2,GENTTROUGH:2,GENTPEAK:2,GENTRANDOM:2,TOBRATROUGH:2,TOBRAPEAK:2,TOBRARND:2,AMIKACINPEAK:2,AMIKACINTROU:2,AMIKACIN:2, in the last 72 hours   Microbiology: Recent Results (from the past 720 hour(s))  CULTURE, BLOOD (ROUTINE X 2)     Status: Normal (Preliminary result)   Collection Time   05/14/11  6:00 PM      Component Value Range Status Comment   Specimen Description BLOOD LEFT ARM   Final    Special Requests BOTTLES DRAWN AEROBIC AND ANAEROBIC 6CC   Final    Culture NO GROWTH 1 DAY   Final    Report Status PENDING   Incomplete   CULTURE, BLOOD (ROUTINE X 2)     Status: Normal (Preliminary result)   Collection Time   05/14/11  6:30 PM      Component Value Range Status Comment   Specimen Description BLOOD RIGHT ARM   Final    Special Requests BOTTLES DRAWN AEROBIC AND ANAEROBIC 6CC   Final    Culture NO GROWTH 1 DAY   Final    Report Status PENDING   Incomplete   MRSA PCR SCREENING     Status: Normal   Collection Time   05/15/11  1:12 AM      Component Value Range Status Comment   MRSA by PCR NEGATIVE  NEGATIVE  Final   CLOSTRIDIUM DIFFICILE BY PCR     Status: Normal   Collection  Time   05/15/11  1:15 AM      Component Value Range Status Comment   C difficile by pcr NEGATIVE  NEGATIVE  Final     Medical History: Past Medical History  Diagnosis Date  . Renal failure   . Polycystic kidney disease   . Chronic renal failure   . Hemodialysis status   . Anemia   . Congenital abnormalities     Orofacial digital syndrome    Medications:  Scheduled:    . acetaminophen  1,000 mg Oral Once  . amLODipine  10 mg Oral Daily  . enoxaparin  30 mg Subcutaneous Q24H  . furosemide  80 mg Oral BID  . pantoprazole  40 mg Oral Q1200  . vancomycin  1,000 mg Intravenous Once   Assessment: Dialysis Patient    Goal of Therapy:  Vancomycin trough level 15-20 mcg/ml  Plan:  Vancomycin 1 GM IV given 05/14/11 2 AM Vancomycin 1 GM after each dialysis  Monica Guerra, Monica Guerra 05/15/2011,8:30 AM

## 2011-05-16 LAB — COMPREHENSIVE METABOLIC PANEL
Albumin: 2.5 g/dL — ABNORMAL LOW (ref 3.5–5.2)
Alkaline Phosphatase: 104 U/L (ref 39–117)
BUN: 25 mg/dL — ABNORMAL HIGH (ref 6–23)
Calcium: 9.2 mg/dL (ref 8.4–10.5)
GFR calc Af Amer: 8 mL/min — ABNORMAL LOW (ref >90)
Potassium: 4.9 mEq/L (ref 3.5–5.1)
Total Protein: 5 g/dL — ABNORMAL LOW (ref 6.0–8.3)

## 2011-05-16 LAB — URINE CULTURE
Colony Count: NO GROWTH
Culture  Setup Time: 201211032231
Culture: NO GROWTH

## 2011-05-16 MED ORDER — PANCRELIPASE (LIP-PROT-AMYL) 12000-38000 UNITS PO CPEP: 2.0000 | ORAL_CAPSULE | Freq: Three times a day (TID) | ORAL | Status: DC

## 2011-05-16 NOTE — Discharge Summary (Signed)
Physician Discharge Summary  Patient ID: Monica Guerra MRN: [id number] DOB/AGE: 1985/11/08 25 y.o.  Admit date: 05/14/2011 Discharge date: 05/16/2011  Discharge Diagnoses:  Principal Problem:  *Back pain, chronic Active Problems:  Fever and chills  ESRD (end stage renal disease) on dialysis  Acute on Chronic pancreatitis  Polycystic kidney disease, congenital  HTN (hypertension)   Current Discharge Medication List    START taking these medications   Details  lipase/protease/amylase (CREON-10/PANCREASE) 12000 UNITS CPEP Take 2 capsules by mouth 3 (three) times daily with meals. Qty: 270 capsule, Refills: 0      CONTINUE these medications which have NOT CHANGED   Details  amLODipine (NORVASC) 10 MG tablet Take 10 mg by mouth daily.      furosemide (LASIX) 80 MG tablet Take 80 mg by mouth 2 (two) times daily.      HYDROcodone-acetaminophen (VICODIN) 5-500 MG per tablet Take 1 tablet by mouth as needed. For pain     omeprazole (PRILOSEC) 20 MG capsule Take 20 mg by mouth daily.      Probiotic Product (PROBIOTIC FORMULA PO) Take 1 tablet by mouth daily.          Discharge Orders    Future Appointments: Provider: Department: Dept Phone: Center:   05/17/2011 7:35 AM [facility] [phone] [facility]     Future Orders Please Complete By Expires   Activity as tolerated - No restrictions      Discharge instructions      Comments:   Low fat, renal diet   Call MD for:  persistant nausea and vomiting      Call MD for:  temperature >100.4         Follow-up Information    Follow up with GOLDING,JOHN CABOT. (If symptoms worsen)    Contact information:   [address] [place] [address] [place] [address] [phone]       Follow up with Malissa Hippo, MD. Make an appointment in 2 weeks.  To workup new diagnosis of chronic pancreatitis. MRCP results and triglyceride level should be back by then.    Contact information:   [address]  [address] [place] [address] [phone]          Disposition: home  Discharged Condition: stable  Consults:  none  Labs:   Results for orders placed during the hospital encounter of 05/14/11 (from the past 48 hour(s))  CULTURE, BLOOD (ROUTINE X 2)     Status: Normal (Preliminary result)   Collection Time   05/14/11  6:00 PM      Component Value Range Comment   Specimen Description BLOOD LEFT ARM      Special Requests BOTTLES DRAWN AEROBIC AND ANAEROBIC 6CC      Culture NO GROWTH 2 DAYS      Report Status PENDING     CBC     Status: Abnormal   Collection Time   05/14/11  6:30 PM      Component Value Range Comment   WBC 11.5 (*) 4.0 - 10.5 (K/uL)    RBC 3.14 (*) 3.87 - 5.11 (MIL/uL)    Hemoglobin 9.5 (*) 12.0 - 15.0 (g/dL)    HCT 25.3 (*) 66.4 - 46.0 (%)    MCV 91.4  78.0 - 100.0 (fL)    MCH 30.3  26.0 - 34.0 (pg)    MCHC 33.1  30.0 - 36.0 (g/dL)    RDW 40.3  47.4 - 25.9 (%)  Platelets 254  150 - 400 (K/uL)   DIFFERENTIAL     Status: Abnormal   Collection Time   05/14/11  6:30 PM      Component Value Range Comment   Neutrophils Relative 83 (*) 43 - 77 (%)    Neutro Abs 9.5 (*) 1.7 - 7.7 (K/uL)    Lymphocytes Relative 9 (*) 12 - 46 (%)    Lymphs Abs 1.0  0.7 - 4.0 (K/uL)    Monocytes Relative 7  3 - 12 (%)    Monocytes Absolute 0.8  0.1 - 1.0 (K/uL)    Eosinophils Relative 1  0 - 5 (%)    Eosinophils Absolute 0.1  0.0 - 0.7 (K/uL)    Basophils Relative 0  0 - 1 (%)    Basophils Absolute 0.0  0.0 - 0.1 (K/uL)   COMPREHENSIVE METABOLIC PANEL     Status: Abnormal   Collection Time   05/14/11  6:30 PM      Component Value Range Comment   Sodium 142  135 - 145 (mEq/L)    Potassium 4.7  3.5 - 5.1 (mEq/L)    Chloride 107  96 - 112 (mEq/L)    CO2 27  19 - 32 (mEq/L)    Glucose, Bld 95  70 - 99 (mg/dL)    BUN 10  6 - 23 (mg/dL)    Creatinine, Ser 1.61 (*) 0.50 - 1.10 (mg/dL)    Calcium 9.7  8.4 - 10.5 (mg/dL)    Total Protein 6.2  6.0 - 8.3 (g/dL)     Albumin 2.9 (*) 3.5 - 5.2 (g/dL)    AST 096 (*) 0 - 37 (U/L) SLIGHT HEMOLYSIS   ALT 59 (*) 0 - 35 (U/L)    Alkaline Phosphatase 127 (*) 39 - 117 (U/L)    Total Bilirubin 0.4  0.3 - 1.2 (mg/dL)    GFR calc non Af Amer 14 (*) >90 (mL/min)    GFR calc Af Amer 16 (*) >90 (mL/min)   LACTIC ACID, PLASMA     Status: Normal   Collection Time   05/14/11  6:30 PM      Component Value Range Comment   Lactic Acid, Venous 1.3  0.5 - 2.2 (mmol/L)   LIPASE, BLOOD     Status: Normal   Collection Time   05/14/11  6:30 PM      Component Value Range Comment   Lipase 57  11 - 59 (U/L)   CULTURE, BLOOD (ROUTINE X 2)     Status: Normal (Preliminary result)   Collection Time   05/14/11  6:30 PM      Component Value Range Comment   Specimen Description BLOOD RIGHT ARM      Special Requests BOTTLES DRAWN AEROBIC AND ANAEROBIC 6CC      Culture NO GROWTH 2 DAYS      Report Status PENDING     MAGNESIUM     Status: Normal   Collection Time   05/14/11  6:30 PM      Component Value Range Comment   Magnesium 1.7  1.5 - 2.5 (mg/dL)   PHOSPHORUS     Status: Abnormal   Collection Time   05/14/11  6:30 PM      Component Value Range Comment   Phosphorus 1.8 (*) 2.3 - 4.6 (mg/dL)   PRO B NATRIURETIC PEPTIDE     Status: Abnormal   Collection Time   05/14/11  6:30 PM      Component Value Range  Comment   BNP, POC 3231.0 (*) 0 - 125 (pg/mL)   URINALYSIS, ROUTINE W REFLEX MICROSCOPIC     Status: Abnormal   Collection Time   05/14/11  6:43 PM      Component Value Range Comment   Color, Urine YELLOW  YELLOW     Appearance CLEAR  CLEAR     Specific Gravity, Urine 1.015  1.005 - 1.030     pH 7.0  5.0 - 8.0     Glucose, UA 100 (*) NEGATIVE (mg/dL)    Hgb urine dipstick SMALL (*) NEGATIVE     Bilirubin Urine NEGATIVE  NEGATIVE     Ketones, ur NEGATIVE  NEGATIVE (mg/dL)    Protein, ur TRACE (*) NEGATIVE (mg/dL)    Urobilinogen, UA 0.2  0.0 - 1.0 (mg/dL)    Nitrite NEGATIVE  NEGATIVE     Leukocytes, UA NEGATIVE   NEGATIVE    URINE CULTURE     Status: Normal   Collection Time   05/14/11  6:43 PM      Component Value Range Comment   Specimen Description URINE, CLEAN CATCH      Special Requests NONE      Setup Time [id number]      Colony Count NO GROWTH      Culture NO GROWTH      Report Status 05/15/2011 FINAL     URINE MICROSCOPIC-ADD ON     Status: Abnormal   Collection Time   05/14/11  6:43 PM      Component Value Range Comment   Squamous Epithelial / LPF FEW (*) RARE     WBC, UA 3-6  <3 (WBC/hpf)    RBC / HPF 3-6  <3 (RBC/hpf)    Bacteria, UA RARE  RARE    URINALYSIS, ROUTINE W REFLEX MICROSCOPIC     Status: Abnormal   Collection Time   05/15/11  1:04 AM      Component Value Range Comment   Color, Urine YELLOW  YELLOW     Appearance CLEAR  CLEAR     Specific Gravity, Urine 1.015  1.005 - 1.030     pH 8.0  5.0 - 8.0     Glucose, UA 100 (*) NEGATIVE (mg/dL)    Hgb urine dipstick TRACE (*) NEGATIVE     Bilirubin Urine NEGATIVE  NEGATIVE     Ketones, ur NEGATIVE  NEGATIVE (mg/dL)    Protein, ur TRACE (*) NEGATIVE (mg/dL)    Urobilinogen, UA 0.2  0.0 - 1.0 (mg/dL)    Nitrite NEGATIVE  NEGATIVE     Leukocytes, UA NEGATIVE  NEGATIVE    URINE MICROSCOPIC-ADD ON     Status: Abnormal   Collection Time   05/15/11  1:04 AM      Component Value Range Comment   Squamous Epithelial / LPF FEW (*) RARE     WBC, UA 0-2  <3 (WBC/hpf)    RBC / HPF 0-2  <3 (RBC/hpf)    Bacteria, UA RARE  RARE    MRSA PCR SCREENING     Status: Normal   Collection Time   05/15/11  1:12 AM      Component Value Range Comment   MRSA by PCR NEGATIVE  NEGATIVE    CLOSTRIDIUM DIFFICILE BY PCR     Status: Normal   Collection Time   05/15/11  1:15 AM      Component Value Range Comment   C difficile by pcr NEGATIVE  NEGATIVE    COMPREHENSIVE METABOLIC  PANEL     Status: Abnormal   Collection Time   05/15/11  6:28 AM      Component Value Range Comment   Sodium 139  135 - 145 (mEq/L)    Potassium 4.5  3.5 - 5.1 (mEq/L)      Chloride 106  96 - 112 (mEq/L)    CO2 26  19 - 32 (mEq/L)    Glucose, Bld 83  70 - 99 (mg/dL)    BUN 14  6 - 23 (mg/dL)    Creatinine, Ser 1.61 (*) 0.50 - 1.10 (mg/dL)    Calcium 9.2  8.4 - 10.5 (mg/dL)    Total Protein 5.3 (*) 6.0 - 8.3 (g/dL)    Albumin 2.6 (*) 3.5 - 5.2 (g/dL)    AST 74 (*) 0 - 37 (U/L)    ALT 51 (*) 0 - 35 (U/L)    Alkaline Phosphatase 100  39 - 117 (U/L)    Total Bilirubin 0.5  0.3 - 1.2 (mg/dL)    GFR calc non Af Amer 10 (*) >90 (mL/min)    GFR calc Af Amer 12 (*) >90 (mL/min)   CBC     Status: Abnormal   Collection Time   05/15/11  6:28 AM      Component Value Range Comment   WBC 7.9  4.0 - 10.5 (K/uL)    RBC 2.73 (*) 3.87 - 5.11 (MIL/uL)    Hemoglobin 8.2 (*) 12.0 - 15.0 (g/dL)    HCT 09.6 (*) 04.5 - 46.0 (%)    MCV 92.3  78.0 - 100.0 (fL)    MCH 30.0  26.0 - 34.0 (pg)    MCHC 32.5  30.0 - 36.0 (g/dL)    RDW 40.9 (*) 81.1 - 15.5 (%)    Platelets 205  150 - 400 (K/uL)   HEMOGLOBIN A1C     Status: Normal   Collection Time   05/15/11  6:28 AM      Component Value Range Comment   Hemoglobin A1C 5.2  <5.7 (%)    Mean Plasma Glucose 103  <117 (mg/dL)   C-REACTIVE PROTEIN     Status: Abnormal   Collection Time   05/15/11  3:07 PM      Component Value Range Comment   CRP 5.17 (*) <0.60 (mg/dL)   SEDIMENTATION RATE     Status: Abnormal   Collection Time   05/15/11  3:07 PM      Component Value Range Comment   Sed Rate 39 (*) 0 - 22 (mm/hr)   TRIGLYCERIDES     Status: Normal   Collection Time   05/16/11  6:55 AM      Component Value Range Comment   Triglycerides 133  <150 (mg/dL)   COMPREHENSIVE METABOLIC PANEL     Status: Abnormal   Collection Time   05/16/11  6:55 AM      Component Value Range Comment   Sodium 138  135 - 145 (mEq/L)    Potassium 4.9  3.5 - 5.1 (mEq/L)    Chloride 105  96 - 112 (mEq/L)    CO2 25  19 - 32 (mEq/L)    Glucose, Bld 85  70 - 99 (mg/dL)    BUN 25 (*) 6 - 23 (mg/dL) DELTA CHECK NOTED   Creatinine, Ser 7.73 (*) 0.50 -  1.10 (mg/dL)    Calcium 9.2  8.4 - 10.5 (mg/dL)    Total Protein 5.0 (*) 6.0 - 8.3 (g/dL)    Albumin 2.5 (*)  3.5 - 5.2 (g/dL)    AST 71 (*) 0 - 37 (U/L)    ALT 56 (*) 0 - 35 (U/L)    Alkaline Phosphatase 104  39 - 117 (U/L)    Total Bilirubin 0.4  0.3 - 1.2 (mg/dL)    GFR calc non Af Amer 7 (*) >90 (mL/min)    GFR calc Af Amer 8 (*) >90 (mL/min)     Diagnostics:  Ct Abdomen Pelvis Wo Contrast  05/15/2011  **ADDENDUM** CREATED: 05/15/2011 17:18:13  After reviewing the nonenhanced CT along with today's ultrasound, there is likely asymmetric left intrahepatic biliary ductal dilatation.  I would recommend further evaluation with MRCP to assess for obstructing stone or lesion.  **END ADDENDUM** SIGNED BY: Aubery Lapping. Dover, M.D.   05/15/2011  *RADIOLOGY REPORT*  Clinical Data: Back pain, fever.  CT ABDOMEN AND PELVIS WITHOUT CONTRAST  Technique:  Multidetector CT imaging of the abdomen and pelvis was performed following the standard protocol without intravenous contrast.  Comparison: 04/21/2011  Findings: Dependent atelectasis in the lung bases.  Trace pleural effusions.  Heart is upper limits normal in size.  Periportal areas of low density again noted as seen on prior study, possibly periportal edema.  This could also represent intrahepatic biliary ductal dilatation, but it is difficult to determine without IV contrast.  Spleen is upper limits normal in size.  No definite focal lesion.  Pancreatic calcifications are noted compatible with chronic pancreatitis.  There is a small amount of stranding adjacent to the pancreatic tail and extending into the left anterior pararenal space.  There is also stranding in the right anterior pararenal space.  Findings may reflect early or mild changes of acute pancreatitis.  Recommend clinical correlation. Small amount of free fluid is seen in the pelvis.  Uterus and adnexa are unremarkable.  Large and small bowel grossly unremarkable.  No acute bony abnormality.   IMPRESSION: Changes of chronic pancreatitis with calcifications present.  There is stranding near the pancreas and extending in the anterior pararenal spaces bilaterally suggesting the possibility of acute pancreatitis.  Question periportal edema versus intrahepatic biliary ductal dilatation.  It is difficult to differentiate without IV contrast.  Bibasilar atelectasis, trace effusions.  Original Report Authenticated By: Cyndie Chime, M.D.   Dg Chest 2 View  05/14/2011  *RADIOLOGY REPORT*  Clinical Data: Fever.  CHEST - 2 VIEW  Comparison: None.  Findings: Left dialysis catheter is in place.  The distal tip was directed laterally into the lateral wall of the upper SVC.  Heart is normal size.  Lungs are clear.  No confluent opacity or effusion.  No acute bony abnormality.  IMPRESSION: No active cardiopulmonary disease.  Original Report Authenticated By: Cyndie Chime, M.D.   Mr Lumbar Spine Wo Contrast  05/15/2011  *RADIOLOGY REPORT*  Clinical Data: Back pain and fever.  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 Evaluate for diskitis or abscess.  MRI LUMBAR SPINE WITHOUT CONTRAST  Technique:  Multiplanar and multiecho pulse sequences of the lumbar spine were obtained without intravenous contrast.  Comparison: Abdominal pelvic CT 05/14/2011.  Findings: CT demonstrates five lumbar type vertebral bodies. Alignment is normal.  There is no evidence of diskitis or osteomyelitis.  There is no evidence of fracture or pars defect.  The conus medullaris extends to the L1-L2 level and appears normal. No focal paraspinal abnormalities are identified.  As demonstrated on recent CT, there is mild retroperitoneal edema.  In addition, there is mild edema within the erector spinae musculature bilaterally.  Both kidneys are grossly abnormal with numerous cysts.  The kidneys are not significantly enlarged.  There is no hydronephrosis.  All of the lumbar discs are well hydrated.  There is a shallow right paracentral disc protrusion  at T11-T12.  There are no significant disc space findings from T12-L1 through L3-L4.  L4-L5:  Minimal disc bulge.  There is minimal fluid in the facet joints bilaterally.  There is no subchondral sclerosis or destruction.  There is no foraminal compromise or nerve root encroachment.  L5-S1:  Minimal facet hypertrophy.  No spinal stenosis or nerve root encroachment.  IMPRESSION:  1.  No evidence of lumbar diskitis or osteomyelitis. 2.  No focal paraspinal fluid collections are identified.  There is retroperitoneal edema as well as mild edema in the posterior paraspinal musculature. 3.  Mild facet hypertrophy at L4-L5 and L5-S1. 4.  Multicystic kidneys.  Original Report Authenticated By: Gerrianne Scale, M.D.   US Abdomen Complete  05/15/2011  *RADIOLOGY REPORT*  Clinical Data: Pancreatitis.  Evaluate for gallstones. History of congenital polycystic kidney disease with a stage V chronic kidney disease.  ABDOMEN ULTRASOUND  Technique:  Complete abdominal ultrasound examination was performed including evaluation of the liver, gallbladder, bile ducts, pancreas, kidneys, spleen, IVC, and abdominal aorta.  Comparison: CT scan from 05/14/2011.  Findings:  Gallbladder:  Gallbladder wall is not well distended.  No gallstones are evident, but gallbladder wall thickness is at upper normal measuring 3 mm.  There is no pericholecystic fluid.  The sonographer reports no sonographic Murphy's sign.  Common Bile Duct:  Nondilated at 3 mm diameter.  Liver:  The no focal intraparenchymal abnormality.  Main portal vein is prominent.  No substantial intrahepatic biliary duct dilatation.  IVC:  Normal.  Pancreas:  Pancreatic head and tail are poorly visualized secondary to overlying, obscuring bowel gas.  Spleen:  Unremarkable  Right kidney:  9.6 cm in long axis.  Diffusely abnormal appearance with cortical thinning and multiple cystic areas, some of which are relatively well defined and others which are not.  Largest identified cyst  measures about 2.4 cm.  Left kidney:  13.7 cm in long axis.  Similar appearance to the contralateral kidney with numerous cystic areas of varying sizes, some of the cystic areas are poorly defined.  Abdominal Aorta:  No aneurysm.  IMPRESSION: Gallbladder is not well distended, but no gallstones are evident. Gallbladder wall thickness is borderline increased without pericholecystic fluid or sonographic Murphy's sign.  Markedly abnormal appearance to the kidneys with cortical thinning and diffuse cystic change bilaterally.  The the patient has a history of congenital renal cystic disease and chronic renal insufficiency.  Original Report Authenticated By: ERIC A. MANSELL, M.D.    Hospital Course: See H&P for complete admission details. The patient is a 25 year old white female with a history of polycystic kidney disease which was recently diagnosed. She now has end-stage renal disease and gets dialysis on Mondays, Wednesday, Friday. She presented with a fever of 101.7 and worsening of her chronic low back pain. She had no cough, dysuria, rash. She had had a fever a week prior to admission and was given vancomycin and dialysis. She also reported epigastric pain which was very severe. She has had intermittent loose stool. She has had no vomiting, but her appetite has been poor. She reports early satiety. She has no previous diagnoses of pancreatitis, but on CAT scan, she was found to have chronic calcific pancreatitis and asymmetric intrahepatic left biliary ductal dilatation. There also was  evidence of an acute component of pancreatitis, despite normal enzymes. A triglyceride level was done and is at this time pending. To my exam, she did have tenderness along the lumbar spine and right iliac area. She had mild epigastric tenderness. She had a mild leukocytosis. Her urinalysis and chest x-ray showed no infection. Her MRI showed no discitis and alert osteomyelitis or abscess. Patient had improvement in her back pain,  abdominal pain, fever. Her blood cultures to date are negative. Clostridium difficile PCR was done and is negative. She has been tolerating a solid diet and was requesting discharge. I suspect her fever was related to acute and chronic pancreatitis. She will be started on pancreatic enzymes before meals. She will need further workup of chronic calcific pancreatitis. I have asked nursing staff to arrange an MRCP and in outpatient followup with Dr. Karilyn Cota. I have also left a message with the patient's mother and await a call back. Total time of the day of discharge is greater than 30 minutes.  Discharge Exam: Blood pressure 134/80, pulse 83, temperature 98.4 F (36.9 C), temperature source Oral, resp. rate 17, height 5\' 4"  (1.626 m), weight 71.804 kg (158 lb 4.8 oz), last menstrual period 04/23/2011, SpO2 96.00%.  Changed from 05/15/2011   Signed: Crista Curb L 05/16/2011, 8:36 AM

## 2011-05-16 NOTE — Progress Notes (Signed)
Discharge Summary: a/o.vss. Saline lock removed. Up ad lib. No complaints of distress. Discharge paperwork and prescriptions given to pt mother. Caregiver verbalized understanding of instructions. Left floor via wheelchair with nursing staff and family member.

## 2011-05-17 ENCOUNTER — Other Ambulatory Visit (HOSPITAL_COMMUNITY): Payer: PRIVATE HEALTH INSURANCE

## 2011-05-19 ENCOUNTER — Ambulatory Visit (HOSPITAL_COMMUNITY)
Admit: 2011-05-19 | Discharge: 2011-05-19 | Disposition: A | Discharge status: Home / Self Care | Payer: PRIVATE HEALTH INSURANCE | Source: Ambulatory Visit | Attending: Internal Medicine | Admitting: Internal Medicine

## 2011-05-19 ENCOUNTER — Other Ambulatory Visit (HOSPITAL_COMMUNITY): Payer: Self-pay | Admitting: Internal Medicine

## 2011-05-19 DIAGNOSIS — K861 Other chronic pancreatitis: Secondary | ICD-10-CM

## 2011-05-19 DIAGNOSIS — Q619 Cystic kidney disease, unspecified: Secondary | ICD-10-CM | POA: Insufficient documentation

## 2011-05-19 DIAGNOSIS — R932 Abnormal findings on diagnostic imaging of liver and biliary tract: Secondary | ICD-10-CM | POA: Insufficient documentation

## 2011-05-19 LAB — CULTURE, BLOOD (ROUTINE X 2)
Culture: NO GROWTH
Culture: NO GROWTH

## 2011-05-26 ENCOUNTER — Encounter (INDEPENDENT_AMBULATORY_CARE_PROVIDER_SITE_OTHER): Payer: Self-pay | Admitting: *Deleted

## 2011-05-28 ENCOUNTER — Encounter (HOSPITAL_COMMUNITY): Payer: Self-pay | Admitting: Emergency Medicine

## 2011-05-28 ENCOUNTER — Other Ambulatory Visit: Payer: Self-pay

## 2011-05-28 ENCOUNTER — Emergency Department (HOSPITAL_COMMUNITY): Payer: PRIVATE HEALTH INSURANCE

## 2011-05-28 ENCOUNTER — Inpatient Hospital Stay (HOSPITAL_COMMUNITY)
Admission: EM | Admit: 2011-05-28 | Discharge: 2011-06-07 | DRG: 871 | Disposition: A | Discharge status: Home / Self Care | Payer: PRIVATE HEALTH INSURANCE | Attending: Internal Medicine | Admitting: Internal Medicine

## 2011-05-28 DIAGNOSIS — Q898 Other specified congenital malformations: Secondary | ICD-10-CM

## 2011-05-28 DIAGNOSIS — T827XXA Infection and inflammatory reaction due to other cardiac and vascular devices, implants and grafts, initial encounter: Secondary | ICD-10-CM | POA: Diagnosis present

## 2011-05-28 DIAGNOSIS — A419 Sepsis, unspecified organism: Secondary | ICD-10-CM

## 2011-05-28 DIAGNOSIS — D649 Anemia, unspecified: Secondary | ICD-10-CM | POA: Diagnosis present

## 2011-05-28 DIAGNOSIS — R110 Nausea: Secondary | ICD-10-CM | POA: Diagnosis not present

## 2011-05-28 DIAGNOSIS — N186 End stage renal disease: Secondary | ICD-10-CM | POA: Diagnosis present

## 2011-05-28 DIAGNOSIS — J189 Pneumonia, unspecified organism: Secondary | ICD-10-CM

## 2011-05-28 DIAGNOSIS — I10 Essential (primary) hypertension: Secondary | ICD-10-CM

## 2011-05-28 DIAGNOSIS — G8929 Other chronic pain: Secondary | ICD-10-CM

## 2011-05-28 DIAGNOSIS — A0472 Enterocolitis due to Clostridium difficile, not specified as recurrent: Secondary | ICD-10-CM | POA: Diagnosis present

## 2011-05-28 DIAGNOSIS — D72819 Decreased white blood cell count, unspecified: Secondary | ICD-10-CM | POA: Diagnosis present

## 2011-05-28 DIAGNOSIS — A4150 Gram-negative sepsis, unspecified: Secondary | ICD-10-CM | POA: Diagnosis not present

## 2011-05-28 DIAGNOSIS — Y849 Medical procedure, unspecified as the cause of abnormal reaction of the patient, or of later complication, without mention of misadventure at the time of the procedure: Secondary | ICD-10-CM | POA: Diagnosis present

## 2011-05-28 DIAGNOSIS — Q619 Cystic kidney disease, unspecified: Secondary | ICD-10-CM

## 2011-05-28 DIAGNOSIS — K862 Cyst of pancreas: Secondary | ICD-10-CM | POA: Diagnosis present

## 2011-05-28 DIAGNOSIS — K861 Other chronic pancreatitis: Secondary | ICD-10-CM | POA: Diagnosis present

## 2011-05-28 DIAGNOSIS — Q613 Polycystic kidney, unspecified: Secondary | ICD-10-CM

## 2011-05-28 DIAGNOSIS — T368X5A Adverse effect of other systemic antibiotics, initial encounter: Secondary | ICD-10-CM | POA: Diagnosis not present

## 2011-05-28 DIAGNOSIS — K828 Other specified diseases of gallbladder: Secondary | ICD-10-CM | POA: Diagnosis present

## 2011-05-28 DIAGNOSIS — Z992 Dependence on renal dialysis: Secondary | ICD-10-CM

## 2011-05-28 DIAGNOSIS — M549 Dorsalgia, unspecified: Secondary | ICD-10-CM | POA: Diagnosis present

## 2011-05-28 DIAGNOSIS — A414 Sepsis due to anaerobes: Principal | ICD-10-CM | POA: Diagnosis present

## 2011-05-28 LAB — COMPREHENSIVE METABOLIC PANEL
ALT: 23 U/L (ref 0–35)
Alkaline Phosphatase: 192 U/L — ABNORMAL HIGH (ref 39–117)
CO2: 28 mEq/L (ref 19–32)
GFR calc Af Amer: 15 mL/min — ABNORMAL LOW (ref >90)
GFR calc non Af Amer: 13 mL/min — ABNORMAL LOW (ref >90)
Glucose, Bld: 91 mg/dL (ref 70–99)
Potassium: 3.4 mEq/L — ABNORMAL LOW (ref 3.5–5.1)
Sodium: 144 mEq/L (ref 135–145)

## 2011-05-28 LAB — CBC
HCT: 24.6 % — ABNORMAL LOW (ref 36.0–46.0)
Hemoglobin: 8 g/dL — ABNORMAL LOW (ref 12.0–15.0)
MCH: 30.1 pg (ref 26.0–34.0)
MCHC: 32.5 g/dL (ref 30.0–36.0)
MCV: 92.3 fL (ref 78.0–100.0)
Platelets: 148 10*3/uL — ABNORMAL LOW (ref 150–400)
RBC: 2.86 MIL/uL — ABNORMAL LOW (ref 3.87–5.11)
RDW: 16 % — ABNORMAL HIGH (ref 11.5–15.5)
WBC: 0.9 10*3/uL — CL (ref 4.0–10.5)

## 2011-05-28 LAB — URINALYSIS, ROUTINE W REFLEX MICROSCOPIC
Glucose, UA: 250 mg/dL — AB
Ketones, ur: NEGATIVE mg/dL
Leukocytes, UA: NEGATIVE
Nitrite: NEGATIVE
Protein, ur: NEGATIVE mg/dL

## 2011-05-28 LAB — DIFFERENTIAL
Basophils Relative: 0 % (ref 0–1)
Eosinophils Relative: 0 % (ref 0–5)
Lymphocytes Relative: 15 % (ref 12–46)
Lymphs Abs: 0.1 10*3/uL — ABNORMAL LOW (ref 0.7–4.0)
Lymphs Abs: 0.3 10*3/uL — ABNORMAL LOW (ref 0.7–4.0)
Monocytes Absolute: 0.4 10*3/uL (ref 0.1–1.0)
Monocytes Relative: 20 % — ABNORMAL HIGH (ref 3–12)
Neutro Abs: 1.5 10*3/uL — ABNORMAL LOW (ref 1.7–7.7)
Neutrophils Relative %: 79 % — ABNORMAL HIGH (ref 43–77)

## 2011-05-28 LAB — PROTIME-INR: INR: 1.11 (ref 0.00–1.49)

## 2011-05-28 LAB — AMYLASE: Amylase: 43 U/L (ref 0–105)

## 2011-05-28 LAB — PHOSPHORUS: Phosphorus: 2.7 mg/dL (ref 2.3–4.6)

## 2011-05-28 LAB — GLUCOSE, CAPILLARY: Glucose-Capillary: 127 mg/dL — ABNORMAL HIGH (ref 70–99)

## 2011-05-28 MED ORDER — HEPARIN SODIUM (PORCINE) 5000 UNIT/ML IJ SOLN: 5000.0000 [IU] | Freq: Three times a day (TID) | INTRAMUSCULAR | Status: DC

## 2011-05-28 MED ORDER — ALBUTEROL SULFATE HFA 108 (90 BASE) MCG/ACT IN AERS
4.0000 | INHALATION_SPRAY | RESPIRATORY_TRACT | Status: DC | PRN
  Filled 2011-05-28: qty 6.7

## 2011-05-28 MED ORDER — VANCOMYCIN HCL IN DEXTROSE 1-5 GM/200ML-% IV SOLN
1000.0000 mg | Freq: Once | INTRAVENOUS | Status: AC
  Administered 2011-05-28: 1000 mg via INTRAVENOUS
  Filled 2011-05-28: qty 200

## 2011-05-28 MED ORDER — SODIUM CHLORIDE 0.9 % IV BOLUS (SEPSIS): 500.0000 mL | Freq: Once | INTRAVENOUS | Status: DC

## 2011-05-28 MED ORDER — ONDANSETRON HCL 4 MG PO TABS
4.0000 mg | ORAL_TABLET | Freq: Four times a day (QID) | ORAL | Status: DC | PRN
  Administered 2011-06-01 – 2011-06-03 (×2): 4 mg via ORAL
  Filled 2011-05-28 (×2): qty 1

## 2011-05-28 MED ORDER — SODIUM CHLORIDE 0.9 % IV SOLN
Freq: Once | INTRAVENOUS | Status: AC
  Administered 2011-05-28: 14:00:00 via INTRAVENOUS

## 2011-05-28 MED ORDER — SODIUM CHLORIDE 0.9 % IV SOLN: INTRAVENOUS | Status: DC

## 2011-05-28 MED ORDER — ONDANSETRON HCL 4 MG/2ML IJ SOLN
4.0000 mg | Freq: Four times a day (QID) | INTRAMUSCULAR | Status: DC | PRN
  Administered 2011-05-28 – 2011-06-04 (×7): 4 mg via INTRAVENOUS
  Filled 2011-05-28 (×6): qty 2

## 2011-05-28 MED ORDER — HEPARIN SODIUM (PORCINE) 5000 UNIT/ML IJ SOLN
5000.0000 [IU] | Freq: Three times a day (TID) | INTRAMUSCULAR | Status: DC
  Administered 2011-05-28 – 2011-06-07 (×25): 5000 [IU] via SUBCUTANEOUS
  Filled 2011-05-28 (×38): qty 1

## 2011-05-28 MED ORDER — PANTOPRAZOLE SODIUM 40 MG PO TBEC
40.0000 mg | DELAYED_RELEASE_TABLET | Freq: Every day | ORAL | Status: DC
  Administered 2011-05-28 – 2011-06-02 (×6): 40 mg via ORAL
  Filled 2011-05-28 (×6): qty 1

## 2011-05-28 MED ORDER — PANTOPRAZOLE SODIUM 40 MG IV SOLR: 40.0000 mg | Freq: Every day | INTRAVENOUS | Status: DC

## 2011-05-28 MED ORDER — SODIUM CHLORIDE 0.9 % IV SOLN
INTRAVENOUS | Status: DC
  Administered 2011-05-28: 18:00:00 via INTRAVENOUS

## 2011-05-28 MED ORDER — PIPERACILLIN-TAZOBACTAM 3.375 G IVPB
3.3750 g | Freq: Once | INTRAVENOUS | Status: AC
  Administered 2011-05-28: 3.375 g via INTRAVENOUS
  Filled 2011-05-28: qty 50

## 2011-05-28 MED ORDER — ACETAMINOPHEN 325 MG PO TABS
650.0000 mg | ORAL_TABLET | Freq: Once | ORAL | Status: AC
  Administered 2011-05-28: 650 mg via ORAL
  Filled 2011-05-28: qty 2

## 2011-05-28 MED ORDER — SODIUM CHLORIDE 0.9 % IV BOLUS (SEPSIS)
500.0000 mL | Freq: Once | INTRAVENOUS | Status: AC
  Administered 2011-05-28: 1000 mL via INTRAVENOUS

## 2011-05-28 MED ORDER — PANCRELIPASE (LIP-PROT-AMYL) 12000-38000 UNITS PO CPEP
2.0000 | ORAL_CAPSULE | Freq: Three times a day (TID) | ORAL | Status: DC
  Administered 2011-05-28 – 2011-06-07 (×12): 2 via ORAL
  Filled 2011-05-28 (×35): qty 2

## 2011-05-28 MED ORDER — SODIUM CHLORIDE 0.9 % IJ SOLN
INTRAMUSCULAR | Status: AC
  Administered 2011-05-28: 10 mL
  Filled 2011-05-28: qty 10

## 2011-05-28 MED ORDER — SODIUM CHLORIDE 0.9 % IV SOLN
INTRAVENOUS | Status: DC
  Administered 2011-05-29: 20:00:00 via INTRAVENOUS

## 2011-05-28 MED ORDER — MORPHINE SULFATE 4 MG/ML IJ SOLN
4.0000 mg | Freq: Once | INTRAMUSCULAR | Status: AC
  Administered 2011-05-28: 4 mg via INTRAVENOUS
  Filled 2011-05-28: qty 1

## 2011-05-28 MED ORDER — ACETAMINOPHEN 325 MG PO TABS
650.0000 mg | ORAL_TABLET | Freq: Four times a day (QID) | ORAL | Status: DC | PRN
  Administered 2011-05-30 – 2011-06-05 (×5): 650 mg via ORAL
  Filled 2011-05-28 (×3): qty 2

## 2011-05-28 MED ORDER — DIPHENHYDRAMINE HCL 50 MG/ML IJ SOLN
25.0000 mg | Freq: Once | INTRAMUSCULAR | Status: AC
  Administered 2011-05-28: 25 mg via INTRAVENOUS
  Filled 2011-05-28: qty 1

## 2011-05-28 NOTE — Progress Notes (Signed)
eLink Physician-Brief Progress Note Patient Name: Jadan Rouillard DOB: [birth year] MRN: [id number]  Date of Service  05/28/2011   HPI/Events of Note   Sepsis, neutropenia`  eICU Interventions  See hold orders for admission, pt with sepsis, neutropenia GNR in blood , ESRD,  ??biliary source   Intervention Category Major Interventions: Sepsis - evaluation and management  Shan Levans 05/28/2011, 7:54 PM

## 2011-05-28 NOTE — ED Notes (Signed)
Family at bedside. Patient is resting at this time.

## 2011-05-28 NOTE — ED Notes (Signed)
Family at bedside. Patient was given Ice chips and family does not need anything at this time.

## 2011-05-28 NOTE — H&P (Signed)
Monica Guerra MRN: [id number] DOB/AGE: 04-09-1986 25 y.o. Primary Care Physician:GOLDING,JOHN CABOT, MD Admit date: 05/28/2011 Chief Complaint: Fever , gram-negative septicemia. 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 She had a fever when she had dialysis 2 days ago. At this time blood cultures were taken and she was given empirical antibiotics. Today she had dialysis also and again she had fever. However blood cultures taken 2 days ago are growing  gram-negative rods, one out of four bottles. The patient describes a cough which is largely nonproductive, she has no abdominal pain or vomiting and she has no urinary symptoms. She does have a subclavian central catheter which is being used for hemodialysis. She did have a right subclavian catheter which failed. She is awaiting to have peritoneal dialysis catheter placed at [facility]. When she was evaluated in the emergency room she was found to be febrile with a resting tachycardia but acceptable blood pressure. Also, she has been found to have extreme leukopenia with a white blood cell count of 0.9 only.  Past Medical History  Diagnosis Date  . Renal failure   . Polycystic kidney disease   . Chronic renal failure   . Hemodialysis status   . Anemia   . Congenital abnormalities     Orofacial digital syndrome   Past Surgical History  Procedure Date  . Central venous catheter insertion     left chest  . Cleft palate repair            Social history: She is single and lives with her parents. She does not smoke and does not drink alcohol.  Allergies:  Allergies  Allergen Reactions  . Vancomycin Itching    Patient usually takes Benadryl Before.     Medications Prior to Admission  Medication Dose Route Frequency Provider Last Rate Last Dose  . 0.9 %  sodium chloride infusion   Intravenous Once Glynn Octave, MD 100 mL/hr at  05/28/11 1357    . 0.9 %  sodium chloride infusion   Intravenous Continuous Nimish C Gosrani      . acetaminophen (TYLENOL) tablet 650 mg  650 mg Oral Q6H PRN Nimish C Gosrani      . diphenhydrAMINE (BENADRYL) injection 25 mg  25 mg Intravenous Once Glynn Octave, MD   25 mg at 05/28/11 1506  . heparin injection 5,000 Units  5,000 Units Subcutaneous Q8H Nimish C Gosrani      . lipase/protease/amylase (CREON-10/PANCREASE) capsule 2 capsule  2 capsule Oral TID WC Nimish C Gosrani      . ondansetron (ZOFRAN) tablet 4 mg  4 mg Oral Q6H PRN Nimish C Gosrani       Or  . ondansetron (ZOFRAN) injection 4 mg  4 mg Intravenous Q6H PRN Nimish C Gosrani      . pantoprazole (PROTONIX) EC tablet 40 mg  40 mg Oral Q1200 Nimish C Gosrani      . piperacillin-tazobactam (ZOSYN) IVPB 3.375 g  3.375 g Intravenous Once Glynn Octave, MD   3.375 g at 05/28/11 1433  . sodium chloride 0.9 % bolus 500 mL  500 mL Intravenous Once Glynn Octave, MD   1,000 mL at 05/28/11 1355  . vancomycin (VANCOCIN) IVPB 1000 mg/200 mL premix  1,000 mg Intravenous Once Glynn Octave, MD   1,000 mg at 05/28/11 1534  . DISCONTD: 0.9 %  sodium chloride infusion   Intravenous Continuous Glynn Octave, MD      .  DISCONTD: sodium chloride 0.9 % bolus 500 mL  500 mL Intravenous Once Glynn Octave, MD       Medications Prior to Admission  Medication Sig Dispense Refill  . lipase/protease/amylase (CREON-10/PANCREASE) 12000 UNITS CPEP Take 2 capsules by mouth 3 (three) times daily with meals.  270 capsule  0  . omeprazole (PRILOSEC) 20 MG capsule Take 20 mg by mouth daily. Upset Stomach      . Probiotic Product (PROBIOTIC FORMULA PO) Take 1 tablet by mouth daily.             ZOX:WRUEA from the symptoms mentioned above,there are no other symptoms referable to all systems reviewed.  Physical Exam: Blood pressure 111/70, pulse 118, temperature 99.9 F (37.7 C), temperature source Oral, resp. rate 20, height 5\' 4"  (1.626 m),  weight 69.9 kg (154 lb 1.6 oz), last menstrual period 04/23/2011, SpO2 99.00%. She is alert and able to communicate. She has warm peripheries. Heart sounds are present and normal without murmurs. Lung fields are clear. Abdomen is soft and nontender. She is alert and orientated. She has no signs of meningism. There is no rash.  Results for orders placed during the hospital encounter of 05/28/11 (from the past 48 hour(s))  CBC     Status: Abnormal   Collection Time   05/28/11  1:44 PM      Component Value Range Comment   WBC 0.9 (*) 4.0 - 10.5 (K/uL)    RBC 2.86 (*) 3.87 - 5.11 (MIL/uL)    Hemoglobin 8.6 (*) 12.0 - 15.0 (g/dL)    HCT 54.0 (*) 98.1 - 46.0 (%)    MCV 92.3  78.0 - 100.0 (fL)    MCH 30.1  26.0 - 34.0 (pg)    MCHC 32.6  30.0 - 36.0 (g/dL)    RDW 19.1 (*) 47.8 - 15.5 (%)    Platelets 148 (*) 150 - 400 (K/uL)   DIFFERENTIAL     Status: Abnormal   Collection Time   05/28/11  1:44 PM      Component Value Range Comment   Neutrophils Relative 79 (*) 43 - 77 (%)    Neutro Abs 0.7 (*) 1.7 - 7.7 (K/uL)    Lymphocytes Relative 15  12 - 46 (%)    Lymphs Abs 0.1 (*) 0.7 - 4.0 (K/uL)    Monocytes Relative 5  3 - 12 (%)    Monocytes Absolute 0.0 (*) 0.1 - 1.0 (K/uL)    Eosinophils Relative 1  0 - 5 (%)    Eosinophils Absolute 0.0  0.0 - 0.7 (K/uL)    Basophils Relative 0  0 - 1 (%)    Basophils Absolute 0.0  0.0 - 0.1 (K/uL)   COMPREHENSIVE METABOLIC PANEL     Status: Abnormal   Collection Time   05/28/11  1:44 PM      Component Value Range Comment   Sodium 144  135 - 145 (mEq/L)    Potassium 3.4 (*) 3.5 - 5.1 (mEq/L)    Chloride 107  96 - 112 (mEq/L)    CO2 28  19 - 32 (mEq/L)    Glucose, Bld 91  70 - 99 (mg/dL)    BUN 15  6 - 23 (mg/dL)    Creatinine, Ser 2.95 (*) 0.50 - 1.10 (mg/dL)    Calcium 9.6  8.4 - 10.5 (mg/dL)    Total Protein 5.8 (*) 6.0 - 8.3 (g/dL)    Albumin 2.9 (*) 3.5 - 5.2 (g/dL)    AST 78 (*)  0 - 37 (U/L)    ALT 23  0 - 35 (U/L)    Alkaline Phosphatase 192  (*) 39 - 117 (U/L)    Total Bilirubin 0.9  0.3 - 1.2 (mg/dL)    GFR calc non Af Amer 13 (*) >90 (mL/min)    GFR calc Af Amer 15 (*) >90 (mL/min)   LACTIC ACID, PLASMA     Status: Normal   Collection Time   05/28/11  1:44 PM      Component Value Range Comment   Lactic Acid, Venous 1.4  0.5 - 2.2 (mmol/L)   LIPASE, BLOOD     Status: Normal   Collection Time   05/28/11  1:44 PM      Component Value Range Comment   Lipase 28  11 - 59 (U/L)   PROTIME-INR     Status: Normal   Collection Time   05/28/11  1:44 PM      Component Value Range Comment   Prothrombin Time 14.5  11.6 - 15.2 (seconds)    INR 1.11  0.00 - 1.49    PROCALCITONIN     Status: Normal   Collection Time   05/28/11  1:53 PM      Component Value Range Comment   Procalcitonin <0.10     CULTURE, BLOOD (ROUTINE X 2)     Status: Normal (Preliminary result)   Collection Time   05/28/11  1:58 PM      Component Value Range Comment   Specimen Description Blood BLOOD LEFT FOREARM      Special Requests BOTTLES DRAWN AEROBIC AND ANAEROBIC 4CC EACH      Culture PENDING      Report Status PENDING     URINALYSIS, ROUTINE W REFLEX MICROSCOPIC     Status: Abnormal   Collection Time   05/28/11  3:01 PM      Component Value Range Comment   Color, Urine YELLOW  YELLOW     Appearance CLEAR  CLEAR     Specific Gravity, Urine 1.015  1.005 - 1.030     pH 7.0  5.0 - 8.0     Glucose, UA 250 (*) NEGATIVE (mg/dL)    Hgb urine dipstick NEGATIVE  NEGATIVE     Bilirubin Urine NEGATIVE  NEGATIVE     Ketones, ur NEGATIVE  NEGATIVE (mg/dL)    Protein, ur NEGATIVE  NEGATIVE (mg/dL)    Urobilinogen, UA 0.2  0.0 - 1.0 (mg/dL)    Nitrite NEGATIVE  NEGATIVE     Leukocytes, UA NEGATIVE  NEGATIVE  MICROSCOPIC NOT DONE ON URINES WITH NEGATIVE PROTEIN, BLOOD, LEUKOCYTES, NITRITE, OR GLUCOSE <1000 mg/dL.  PREGNANCY, URINE     Status: Normal   Collection Time   05/28/11  3:01 PM      Component Value Range Comment   Preg Test, Ur NEGATIVE       Recent Results (from the past 240 hour(s))  CULTURE, BLOOD (ROUTINE X 2)     Status: Normal (Preliminary result)   Collection Time   05/28/11  1:58 PM      Component Value Range Status Comment   Specimen Description Blood BLOOD LEFT FOREARM   Final    Special Requests BOTTLES DRAWN AEROBIC AND ANAEROBIC 4CC EACH   Final    Culture PENDING   Incomplete    Report Status PENDING   Incomplete     Ct Abdomen Pelvis Wo Contrast  05/15/2011  **ADDENDUM** CREATED: 05/15/2011 17:18:13  After reviewing the nonenhanced CT along  with today's ultrasound, there is likely asymmetric left intrahepatic biliary ductal dilatation.  I would recommend further evaluation with MRCP to assess for obstructing stone or lesion.  **END ADDENDUM** SIGNED BY: Aubery Lapping. Dover, M.D.   05/15/2011  *RADIOLOGY REPORT*  Clinical Data: Back pain, fever.  CT ABDOMEN AND PELVIS WITHOUT CONTRAST  Technique:  Multidetector CT imaging of the abdomen and pelvis was performed following the standard protocol without intravenous contrast.  Comparison: 04/21/2011  Findings: Dependent atelectasis in the lung bases.  Trace pleural effusions.  Heart is upper limits normal in size.  Periportal areas of low density again noted as seen on prior study, possibly periportal edema.  This could also represent intrahepatic biliary ductal dilatation, but it is difficult to determine without IV contrast.  Spleen is upper limits normal in size.  No definite focal lesion.  Pancreatic calcifications are noted compatible with chronic pancreatitis.  There is a small amount of stranding adjacent to the pancreatic tail and extending into the left anterior pararenal space.  There is also stranding in the right anterior pararenal space.  Findings may reflect early or mild changes of acute pancreatitis.  Recommend clinical correlation. Small amount of free fluid is seen in the pelvis.  Uterus and adnexa are unremarkable.  Large and small bowel grossly unremarkable.  No  acute bony abnormality.  IMPRESSION: Changes of chronic pancreatitis with calcifications present.  There is stranding near the pancreas and extending in the anterior pararenal spaces bilaterally suggesting the possibility of acute pancreatitis.  Question periportal edema versus intrahepatic biliary ductal dilatation.  It is difficult to differentiate without IV contrast.  Bibasilar atelectasis, trace effusions.  Original Report Authenticated By: Cyndie Chime, M.D.   Dg Chest 2 View  05/14/2011  *RADIOLOGY REPORT*  Clinical Data: Fever.  CHEST - 2 VIEW  Comparison: None.  Findings: Left dialysis catheter is in place.  The distal tip was directed laterally into the lateral wall of the upper SVC.  Heart is normal size.  Lungs are clear.  No confluent opacity or effusion.  No acute bony abnormality.  IMPRESSION: No active cardiopulmonary disease.  Original Report Authenticated By: Cyndie Chime, M.D.   Mr Lumbar Spine Wo Contrast  05/15/2011  *RADIOLOGY REPORT*  Clinical Data: Back pain and fever.  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 Evaluate for diskitis or abscess.  MRI LUMBAR SPINE WITHOUT CONTRAST  Technique:  Multiplanar and multiecho pulse sequences of the lumbar spine were obtained without intravenous contrast.  Comparison: Abdominal pelvic CT 05/14/2011.  Findings: CT demonstrates five lumbar type vertebral bodies. Alignment is normal.  There is no evidence of diskitis or osteomyelitis.  There is no evidence of fracture or pars defect.  The conus medullaris extends to the L1-L2 level and appears normal. No focal paraspinal abnormalities are identified.  As demonstrated on recent CT, there is mild retroperitoneal edema.  In addition, there is mild edema within the erector spinae musculature bilaterally.  Both kidneys are grossly abnormal with numerous cysts.  The kidneys are not significantly enlarged.  There is no hydronephrosis.  All of the lumbar discs are well hydrated.  There is a shallow right  paracentral disc protrusion at T11-T12.  There are no significant disc space findings from T12-L1 through L3-L4.  L4-L5:  Minimal disc bulge.  There is minimal fluid in the facet joints bilaterally.  There is no subchondral sclerosis or destruction.  There is no foraminal compromise or nerve root encroachment.  L5-S1:  Minimal facet hypertrophy.  No spinal stenosis or nerve root encroachment.  IMPRESSION:  1.  No evidence of lumbar diskitis or osteomyelitis. 2.  No focal paraspinal fluid collections are identified.  There is retroperitoneal edema as well as mild edema in the posterior paraspinal musculature. 3.  Mild facet hypertrophy at L4-L5 and L5-S1. 4.  Multicystic kidneys.  Original Report Authenticated By: Gerrianne Scale, M.D.   US Abdomen Complete  05/15/2011  *RADIOLOGY REPORT*  Clinical Data: Pancreatitis.  Evaluate for gallstones. History of congenital polycystic kidney disease with a stage V chronic kidney disease.  ABDOMEN ULTRASOUND  Technique:  Complete abdominal ultrasound examination was performed including evaluation of the liver, gallbladder, bile ducts, pancreas, kidneys, spleen, IVC, and abdominal aorta.  Comparison: CT scan from 05/14/2011.  Findings:  Gallbladder:  Gallbladder wall is not well distended.  No gallstones are evident, but gallbladder wall thickness is at upper normal measuring 3 mm.  There is no pericholecystic fluid.  The sonographer reports no sonographic Murphy's sign.  Common Bile Duct:  Nondilated at 3 mm diameter.  Liver:  The no focal intraparenchymal abnormality.  Main portal vein is prominent.  No substantial intrahepatic biliary duct dilatation.  IVC:  Normal.  Pancreas:  Pancreatic head and tail are poorly visualized secondary to overlying, obscuring bowel gas.  Spleen:  Unremarkable  Right kidney:  9.6 cm in long axis.  Diffusely abnormal appearance with cortical thinning and multiple cystic areas, some of which are relatively well defined and others which are  not.  Largest identified cyst measures about 2.4 cm.  Left kidney:  13.7 cm in long axis.  Similar appearance to the contralateral kidney with numerous cystic areas of varying sizes, some of the cystic areas are poorly defined.  Abdominal Aorta:  No aneurysm.  IMPRESSION: Gallbladder is not well distended, but no gallstones are evident. Gallbladder wall thickness is borderline increased without pericholecystic fluid or sonographic Murphy's sign.  Markedly abnormal appearance to the kidneys with cortical thinning and diffuse cystic change bilaterally.  The the patient has a history of congenital renal cystic disease and chronic renal insufficiency.  Original Report Authenticated By: ERIC A. MANSELL, M.D.   Mr Mrcp  05/19/2011  *RADIOLOGY REPORT*  Clinical Data: MRCP, chronic pancreatitis.  MRI ABDOMEN WITHOUT CONTRAST (MRCP)  Technique:  Multiplanar multisequence MR imaging of the abdomen was performed, including heavily T2-weighted images of the biliary and pancreatic ducts.  Three-dimensional MR images were rendered by post processing of the original MR data.  Comparison:  CT 05/14/2011, ultrasound 05/15/2011  Findings:  No pericardial fluid or pleural fluid.  There is near complete replacement of the renal parenchyma by multiple renal cysts consistent with polycystic renal disease.  There is biliary ductal ectasia and small cystic lesions along the left and right intrahepatic biliary ducts.  There are several focal cystic ductal lesions measuring up to 12 mm within the left ductal system.  The ductal ectasia/cystic change is more prominent within the lateral hepatic lobe (image 26, series 2).  There is no obstructing lesion evident at the confluence of the hepatic ducts. The common bile duct and common hepatic duct are normal caliber. The pancreatic duct is not dilated.  There is a thin accessory duct to communicating with the main pancreatic duct (ductus divisum) .  There are multiple small cystic lesions  within the uncinate process of the pancreas, pancreatic body, and pancreatic tail.  There is best seen on coronal image 15 and 19, series 2.  There is no focal hepatic  lesion.  The gallbladder is collapsed. The spleen is normal.  Adrenal glands appear normal.  Abdominal aorta is normal.  No skeletal lesion evident.  IMPRESSION:  1.  Complete replacement of the kidneys by innumerable cysts consistent with polycystic kidney disease. 2.  Extensive biliary ductal ectasia more severe within the left hepatic lobe but involving all hepatic ducts is  related to the polycystic kidney disease.  3.  Ductal ectasia and cystic change within the pancreatic head, body and tail also related to polycystic kidney disease. 4.  Ductus divisum variant anatomy.  No evidence of acute pancreatitis.  Original Report Authenticated By: Genevive Bi, M.D.   Mr 3d Recon At Scanner  05/19/2011  *RADIOLOGY REPORT*  Clinical Data: MRCP, chronic pancreatitis.  MRI ABDOMEN WITHOUT CONTRAST (MRCP)  Technique:  Multiplanar multisequence MR imaging of the abdomen was performed, including heavily T2-weighted images of the biliary and pancreatic ducts.  Three-dimensional MR images were rendered by post processing of the original MR data.  Comparison:  CT 05/14/2011, ultrasound 05/15/2011  Findings:  No pericardial fluid or pleural fluid.  There is near complete replacement of the renal parenchyma by multiple renal cysts consistent with polycystic renal disease.  There is biliary ductal ectasia and small cystic lesions along the left and right intrahepatic biliary ducts.  There are several focal cystic ductal lesions measuring up to 12 mm within the left ductal system.  The ductal ectasia/cystic change is more prominent within the lateral hepatic lobe (image 26, series 2).  There is no obstructing lesion evident at the confluence of the hepatic ducts. The common bile duct and common hepatic duct are normal caliber. The pancreatic duct is not  dilated.  There is a thin accessory duct to communicating with the main pancreatic duct (ductus divisum) .  There are multiple small cystic lesions within the uncinate process of the pancreas, pancreatic body, and pancreatic tail.  There is best seen on coronal image 15 and 19, series 2.  There is no focal hepatic lesion.  The gallbladder is collapsed. The spleen is normal.  Adrenal glands appear normal.  Abdominal aorta is normal.  No skeletal lesion evident.  IMPRESSION:  1.  Complete replacement of the kidneys by innumerable cysts consistent with polycystic kidney disease. 2.  Extensive biliary ductal ectasia more severe within the left hepatic lobe but involving all hepatic ducts is  related to the polycystic kidney disease.  3.  Ductal ectasia and cystic change within the pancreatic head, body and tail also related to polycystic kidney disease. 4.  Ductus divisum variant anatomy.  No evidence of acute pancreatitis.  Original Report Authenticated By: Genevive Bi, M.D.   Dg Chest Portable 1 View  05/28/2011  *RADIOLOGY REPORT*  Clinical Data: Sepsis, fever  PORTABLE CHEST - 1 VIEW  Comparison: 05/14/2011  Findings: Lungs are clear. No pleural effusion or pneumothorax.  The heart is top normal in size.  Stable left subclavian dual lumen dialysis catheter.  IMPRESSION: No evidence of acute cardiopulmonary disease.  Original Report Authenticated By: Charline Bills, M.D.   Impression: 1. Gram negative sepsis with severe leukopenia. 2. End-stage renal disease on hemodialysis. 3. Cystic kidney disease. 4. Multiple cysts in the biliary system and pancreas with chronic pancreatitis. 5. Orofacial digital syndrome.      Plan: 1. Admit to intensive care unit. 2. Intravenous Zosyn. 3. Intravenous fluids. I've spoken with Dr Fausto Skillern, the patient's nephrologist, and Dr. Shan Levans, Critical Care Medicine at [facility]. All are in agreement  that the patient should be transferred to [facility]  cone intensive care unit for further treatment and observation in light of severe leukopenia and possible severe sepsis. I've spoken with the patient's parents and informed him of this transfer.      GOSRANI,NIMISH C 05/28/2011, 4:37 PM

## 2011-05-28 NOTE — ED Provider Notes (Signed)
History     CSN: [id number] Arrival date & time: 05/28/2011  1:17 PM   First MD Initiated Contact with Patient 05/28/11 1340      Chief Complaint  Patient presents with  . Blood Infection    (Consider location/radiation/quality/duration/timing/severity/associated sxs/prior treatment) HPI Comments: Patient presents from dialysis with chief complaint of feeling hot all over. She was seen at dialysis on Wednesday and found to have a fever. Blood cultures drawn at that time came back positive today for gram-negative rods. She had dialysis today and then was sent to the hospital.  She complains of pain and hotness all over but denies any specific area of pain. She denies any chest pain, shortness of breath, abdominal pain. She's had no nausea or vomiting. She's had no rashes. She's had no bleeding or drainage from her dialysis catheter.  The history is provided by the patient and a relative.    Past Medical History  Diagnosis Date  . Renal failure   . Polycystic kidney disease   . Chronic renal failure   . Hemodialysis status   . Anemia   . Congenital abnormalities     Orofacial digital syndrome    Past Surgical History  Procedure Date  . Central venous catheter insertion     left chest  . Cleft palate repair     History reviewed. No pertinent family history.  History  Substance Use Topics  . Smoking status: Never Smoker   . Smokeless tobacco: Not on file  . Alcohol Use: No    OB History    Grav Para Term Preterm Abortions TAB SAB Ect Mult Living                  Review of Systems  Constitutional: Positive for fever, chills, activity change, appetite change and fatigue.  HENT: Negative for congestion and rhinorrhea.   Respiratory: Negative for cough and shortness of breath.   Cardiovascular: Negative for chest pain.  Gastrointestinal: Negative for nausea, vomiting and abdominal pain.  Genitourinary: Negative for dysuria and hematuria.  Musculoskeletal: Positive  for myalgias and arthralgias.  Skin: Negative for rash.  Neurological: Positive for weakness. Negative for dizziness and headaches.    Allergies  Vancomycin  Home Medications   Current Outpatient Rx  Name Route Sig Dispense Refill  . ACETAMINOPHEN 500 MG PO TABS Oral Take 1,000 mg by mouth every 6 (six) hours as needed. Pain     . AMLODIPINE BESYLATE 5 MG PO TABS Oral Take 5 mg by mouth daily.      . FUROSEMIDE 20 MG PO TABS Oral Take 20 mg by mouth 2 (two) times daily.      Marland Kitchen PANCRELIPASE (LIP-PROT-AMYL) 12000 UNITS PO CPEP Oral Take 2 capsules by mouth 3 (three) times daily with meals. 270 capsule 0  . OMEPRAZOLE 20 MG PO CPDR Oral Take 20 mg by mouth daily. Upset Stomach    . PROBIOTIC FORMULA PO Oral Take 1 tablet by mouth daily.        BP 123/54  Pulse 119  Temp(Src) 99.9 F (37.7 C) (Oral)  Resp 31  Ht 5\' 4"  (1.626 m)  Wt 154 lb 1.6 oz (69.9 kg)  BMI 26.45 kg/m2  SpO2 93%  LMP 04/23/2011  Physical Exam  Constitutional: She is oriented to person, place, and time. She appears well-developed and well-nourished.       Tachycardic, speaking in full sentences  HENT:  Head: Normocephalic and atraumatic.  Mouth/Throat: Oropharynx is clear and moist.  No oropharyngeal exudate.  Eyes: Conjunctivae are normal. Pupils are equal, round, and reactive to light.  Neck: Normal range of motion.  Cardiovascular: Normal rate, regular rhythm and normal heart sounds.   Pulmonary/Chest: Breath sounds normal. No respiratory distress.       Left subclavian dialysis catheter without any erythema or bleeding  Abdominal: Soft. There is no tenderness. There is no rebound and no guarding.  Musculoskeletal: Normal range of motion. She exhibits no edema and no tenderness.  Neurological: She is alert and oriented to person, place, and time. No cranial nerve deficit.  Skin: Skin is warm.    ED Course  Procedures (including critical care time)  Labs Reviewed  CBC - Abnormal; Notable for the  following:    WBC 0.9 (*)    RBC 2.86 (*)    Hemoglobin 8.6 (*)    HCT 26.4 (*)    RDW 15.6 (*)    Platelets 148 (*)    All other components within normal limits  DIFFERENTIAL - Abnormal; Notable for the following:    Neutrophils Relative 79 (*)    Neutro Abs 0.7 (*)    Lymphs Abs 0.1 (*)    Monocytes Absolute 0.0 (*)    All other components within normal limits  COMPREHENSIVE METABOLIC PANEL - Abnormal; Notable for the following:    Potassium 3.4 (*)    Creatinine, Ser 4.47 (*)    Total Protein 5.8 (*)    Albumin 2.9 (*)    AST 78 (*)    Alkaline Phosphatase 192 (*)    GFR calc non Af Amer 13 (*)    GFR calc Af Amer 15 (*)    All other components within normal limits  URINALYSIS, ROUTINE W REFLEX MICROSCOPIC - Abnormal; Notable for the following:    Glucose, UA 250 (*)    All other components within normal limits  LACTIC ACID, PLASMA  PROCALCITONIN  CULTURE, BLOOD (ROUTINE X 2)  PREGNANCY, URINE  LIPASE, BLOOD  PROTIME-INR  CULTURE, BLOOD (ROUTINE X 2)   Dg Chest Portable 1 View  05/28/2011  *RADIOLOGY REPORT*  Clinical Data: Sepsis, fever  PORTABLE CHEST - 1 VIEW  Comparison: 05/14/2011  Findings: Lungs are clear. No pleural effusion or pneumothorax.  The heart is top normal in size.  Stable left subclavian dual lumen dialysis catheter.  IMPRESSION: No evidence of acute cardiopulmonary disease.  Original Report Authenticated By: Charline Bills, M.D.     1. Sepsis   2. Neutropenia       MDM  Dialysis patient with positive blood cultures and sepsis.  She is tachycardic, febrile and borderline hypotensive. We'll initiate judicious fluid resuscitation, broad spectrum antibiotics, repeat blood cultures.  Tachycardia, fever, consistent with sepsis, likely dialysis catheter infection.  Leukopenia noted and patient placed in neutropenic precautions.  Suspect sepsis as source.  D/w Dr. Karilyn Cota.  Given the ongoing issues, severe sepsis and leukopenia, patient will likely  need transfer to [facility]  Dr. Karilyn Cota discussed with critical care and nephrology who agree to transfer and accept patient.     CRITICAL CARE Performed by: Glynn Octave   Total critical care time: 60  Critical care time was exclusive of separately billable procedures and treating other patients.  Critical care was necessary to treat or prevent imminent or life-threatening deterioration.  Critical care was time spent personally by me on the following activities: development of treatment plan with patient and/or surrogate as well as nursing, discussions with consultants, evaluation of patient's response to treatment,  examination of patient, obtaining history from patient or surrogate, ordering and performing treatments and interventions, ordering and review of laboratory studies, ordering and review of radiographic studies, pulse oximetry and re-evaluation of patient's condition.    Date: 05/28/2011  Rate: 119  Rhythm: sinus tachycardia  QRS Axis: normal  Intervals: PR prolonged  ST/T Wave abnormalities: nonspecific ST/T changes  Conduction Disutrbances:none  Narrative Interpretation:   Old EKG Reviewed: unchangednone available      Glynn Octave, MD 05/28/11 1926

## 2011-05-28 NOTE — ED Notes (Signed)
Family at bedside. Patient is resting family does not need anything at this time.

## 2011-05-28 NOTE — ED Notes (Signed)
Pt c/o generalized aches and fever. Pt sent by dialysis for ? Sepsis.

## 2011-05-28 NOTE — ED Notes (Signed)
Attempted to call report to [phone], spoke with Jennifer-states charge nurse unable to take report at this time because she is dealing with a situation. Will attempt to call report again.

## 2011-05-28 NOTE — ED Notes (Signed)
Family at bedside.Patient is stating she is cold and hot at the same time. Patient was stripped down to a gown and ice pack given to cool her forehead. Patient was given a sheet due to her fever no warm blankets given at this time.

## 2011-05-29 ENCOUNTER — Inpatient Hospital Stay (HOSPITAL_COMMUNITY): Payer: PRIVATE HEALTH INSURANCE

## 2011-05-29 DIAGNOSIS — I959 Hypotension, unspecified: Secondary | ICD-10-CM

## 2011-05-29 DIAGNOSIS — A419 Sepsis, unspecified organism: Secondary | ICD-10-CM

## 2011-05-29 DIAGNOSIS — I319 Disease of pericardium, unspecified: Secondary | ICD-10-CM

## 2011-05-29 LAB — CARDIAC PANEL(CRET KIN+CKTOT+MB+TROPI)
CK, MB: 1.4 ng/mL (ref 0.3–4.0)
CK, MB: 1.4 ng/mL (ref 0.3–4.0)
CK, MB: 1.4 ng/mL (ref 0.3–4.0)
Relative Index: INVALID (ref 0.0–2.5)
Total CK: 12 U/L (ref 7–177)
Total CK: 10 U/L (ref 7–177)
Troponin I: <0.3 ng/mL (ref <0.30)
Troponin I: <0.3 ng/mL (ref <0.30)

## 2011-05-29 LAB — CBC
Hemoglobin: 7.9 g/dL — ABNORMAL LOW (ref 12.0–15.0)
MCH: 30 pg (ref 26.0–34.0)
MCHC: 32 g/dL (ref 30.0–36.0)
RDW: 16.4 % — ABNORMAL HIGH (ref 11.5–15.5)

## 2011-05-29 LAB — BASIC METABOLIC PANEL
BUN: 22 mg/dL (ref 6–23)
Calcium: 9.1 mg/dL (ref 8.4–10.5)
GFR calc Af Amer: 11 mL/min — ABNORMAL LOW (ref >90)
GFR calc non Af Amer: 9 mL/min — ABNORMAL LOW (ref >90)
Glucose, Bld: 86 mg/dL (ref 70–99)
Sodium: 142 mEq/L (ref 135–145)

## 2011-05-29 LAB — D-DIMER, QUANTITATIVE: D-Dimer, Quant: 6.38 ug/mL-FEU — ABNORMAL HIGH (ref 0.00–0.48)

## 2011-05-29 LAB — STREP PNEUMONIAE URINARY ANTIGEN: Strep Pneumo Urinary Antigen: NEGATIVE

## 2011-05-29 MED ORDER — TOBRAMYCIN SULFATE 80 MG/2ML IJ SOLN
132.0000 mg | INTRAMUSCULAR | Status: DC
  Filled 2011-05-29: qty 3.3

## 2011-05-29 MED ORDER — PIPERACILLIN-TAZOBACTAM IN DEX 2-0.25 GM/50ML IV SOLN
2.2500 g | Freq: Three times a day (TID) | INTRAVENOUS | Status: DC
  Administered 2011-05-29 – 2011-06-01 (×10): 2.25 g via INTRAVENOUS
  Filled 2011-05-29 (×13): qty 50

## 2011-05-29 MED ORDER — TOBRAMYCIN SULFATE 80 MG/2ML IJ SOLN
172.0000 mg | Freq: Once | INTRAVENOUS | Status: AC
  Administered 2011-05-29: 172 mg via INTRAVENOUS
  Filled 2011-05-29 (×2): qty 4.3

## 2011-05-29 NOTE — Progress Notes (Signed)
  Echocardiogram 2D Echocardiogram has been performed.  Lydon Vansickle, Real Cons 05/29/2011, 11:06 AM

## 2011-05-29 NOTE — Progress Notes (Signed)
ANTIBIOTIC CONSULT NOTE - INITIAL  Pharmacy Consult for Zosyn Indication: GNR bacteremia/neutropenia  Allergies  Allergen Reactions  . Vancomycin Itching    Patient usually takes Benadryl Before.     Patient Measurements: Height: 5\' 4"  (162.6 cm) Weight: 159 lb 9.8 oz (72.4 kg) IBW/kg (Calculated) : 54.7    Vital Signs: Temp: 97.9 F (36.6 C) (11/17 0008) Temp src: Oral (11/17 0008) BP: 87/41 mmHg (11/16 2300) Pulse Rate: 56  (11/16 2300) Intake/Output from previous day: 11/16 0701 - 11/17 0700 In: 620 [P.O.:620] Out: 350 [Urine:350] Intake/Output from this shift: Total I/O In: 120 [P.O.:120] Out: -   Labs:  [facility] 05/28/11 2105 05/28/11 1344  WBC 2.2* 0.9*  HGB 8.0* 8.6*  PLT 137* 148*  LABCREA -- --  CREATININE 5.21* 4.47*   Estimated Creatinine Clearance: 16.4 ml/min (by C-G formula based on Cr of 5.21).   Microbiology: Recent Results (from the past 720 hour(s))  CULTURE, BLOOD (ROUTINE X 2)     Status: Normal   Collection Time   05/14/11  6:00 PM      Component Value Range Status Comment   Specimen Description BLOOD LEFT ARM   Final    Special Requests BOTTLES DRAWN AEROBIC AND ANAEROBIC 6CC   Final    Culture NO GROWTH 5 DAYS   Final    Report Status 05/19/2011 FINAL   Final   CULTURE, BLOOD (ROUTINE X 2)     Status: Normal   Collection Time   05/14/11  6:30 PM      Component Value Range Status Comment   Specimen Description BLOOD RIGHT ARM   Final    Special Requests BOTTLES DRAWN AEROBIC AND ANAEROBIC 6CC   Final    Culture NO GROWTH 5 DAYS   Final    Report Status 05/19/2011 FINAL   Final   URINE CULTURE     Status: Normal   Collection Time   05/14/11  6:43 PM      Component Value Range Status Comment   Specimen Description URINE, CLEAN CATCH   Final    Special Requests NONE   Final    Setup Time [id number]   Final    Colony Count NO GROWTH   Final    Culture NO GROWTH   Final    Report Status 05/15/2011 FINAL   Final   URINE CULTURE      Status: Normal   Collection Time   05/15/11  1:04 AM      Component Value Range Status Comment   Specimen Description URINE, CLEAN CATCH   Final    Special Requests NONE   Final    Setup Time [id number]   Final    Colony Count NO GROWTH   Final    Culture NO GROWTH   Final    Report Status 05/16/2011 FINAL   Final   MRSA PCR SCREENING     Status: Normal   Collection Time   05/15/11  1:12 AM      Component Value Range Status Comment   MRSA by PCR NEGATIVE  NEGATIVE  Final   CLOSTRIDIUM DIFFICILE BY PCR     Status: Normal   Collection Time   05/15/11  1:15 AM      Component Value Range Status Comment   C difficile by pcr NEGATIVE  NEGATIVE  Final   CULTURE, BLOOD (ROUTINE X 2)     Status: Normal (Preliminary result)   Collection Time   05/28/11  1:58 PM  Component Value Range Status Comment   Specimen Description Blood BLOOD LEFT FOREARM   Final    Special Requests BOTTLES DRAWN AEROBIC AND ANAEROBIC 4CC EACH   Final    Culture PENDING   Incomplete    Report Status PENDING   Incomplete   MRSA PCR SCREENING     Status: Normal   Collection Time   05/28/11  8:04 PM      Component Value Range Status Comment   MRSA by PCR NEGATIVE  NEGATIVE  Final     Medical History: Past Medical History  Diagnosis Date  . Renal failure   . Polycystic kidney disease   . Chronic renal failure   . Hemodialysis status   . Anemia   . Congenital abnormalities     Orofacial digital syndrome     Assessment: 25 y.o. female known to pharmacy from tobramycin dosing. Pt also to be on zosyn which offers very broad coverage with double coverage of GNR. 1/4 blood cultures at outside side grew GNR. Pt ESRD with HD 11/16.   Plan:  1. Zosyn 2.25gm IV q8h 2. F/u microbiological data  Lavonia Dana 05/29/2011,4:17 AM

## 2011-05-29 NOTE — Plan of Care (Signed)
Problem: Phase I Progression Outcomes Goal: Pain controlled with appropriate interventions Outcome: Completed/Met Date Met:  05/29/11 Denies pain Goal: Dyspnea controlled at rest Outcome: Completed/Met Date Met:  05/29/11 Denies sob

## 2011-05-29 NOTE — Consult Note (Addendum)
Patient name: Monica Guerra Medical record number: [id number] Date of birth: 02/14/1986 Age: 25 y.o. Gender: female PCP: Colette Ribas, MD  Date: 05/29/2011 Reason for Consult: asked to see patient for sepsis Referring Physician: Karilyn Cota  Brief history 25yo F hx ESRD secondary to PCKD recently started HD. Presented with fevers and blood cx + for GNR. Hemodynamically stable.  Lines/tubes ? L [place] HD catheter >>>  Culture data/sepsis markers 11/16 blood cx >>> 11/17 urine cx >>>  Antibiotics 11/16 zosyn >>> 11/16 vanc >>> 11/16 tobra >>>  Best practice SQH PPI  Protocols/consults renal  Events/studies n/a  HPI: 25yo F with history of ESRD (recently started on HD with temp cath)secondary to PCKD who presented with 2 days of fever that began during HD. Blood cultures taken at that time and empiric ABX started (?). Those cultures were 1/4 + for GNR. Sent to ER where she was febrile, tachycardic, and neutropenic. MAPs are normal. Lactate is normal and PCT is NOT elevated.  Past Medical History  Diagnosis Date  . Renal failure   . Polycystic kidney disease   . Chronic renal failure   . Hemodialysis status   . Anemia   . Congenital abnormalities     Orofacial digital syndrome   Past Surgical History  Procedure Date  . Central venous catheter insertion     left chest  . Cleft palate repair    History reviewed. No pertinent family history.  Social History:  reports that she has never smoked. She does not have any smokeless tobacco history on file. She reports that she does not drink alcohol or use illicit drugs.  Allergies:  Allergies  Allergen Reactions  . Vancomycin Itching    Patient usually takes Benadryl Before.    Medications:  Prior to Admission medications   Medication Sig Start Date End Date Taking? Authorizing Provider  acetaminophen (TYLENOL) 500 MG tablet Take 1,000 mg by mouth every 6 (six) hours as needed. Pain    Yes Historical Provider, MD    amLODipine (NORVASC) 5 MG tablet Take 5 mg by mouth daily.     Yes Historical Provider, MD  furosemide (LASIX) 20 MG tablet Take 20 mg by mouth 2 (two) times daily.     Yes Historical Provider, MD  lipase/protease/amylase (CREON-10/PANCREASE) 12000 UNITS CPEP Take 2 capsules by mouth 3 (three) times daily with meals. 05/16/11  Yes Corinna L Sullivan  omeprazole (PRILOSEC) 20 MG capsule Take 20 mg by mouth daily. Upset Stomach   Yes Historical Provider, MD  Probiotic Product (PROBIOTIC FORMULA PO) Take 1 tablet by mouth daily.     Yes Historical Provider, MD   ROS for 10/14 systems unremarkable unless stated above otherwise   Temp:  [97.9 F (36.6 C)-103 F (39.4 C)] 98 F (36.7 C) (11/17 0418) Pulse Rate:  [53-139] 53  (11/17 0400) Resp:  [14-33] 17  (11/17 0400) BP: (67-123)/(41-84) 83/45 mmHg (11/17 0400) SpO2:  [93 %-100 %] 100 % (11/17 0400) Weight:  [69.9 kg (154 lb 1.6 oz)-72.4 kg (159 lb 9.8 oz)] 159 lb 9.8 oz (72.4 kg) (11/16 2000)    Intake/Output Summary (Last 24 hours) at 05/29/11 0538 Last data filed at 05/28/11 2100  Gross per 24 hour  Intake    620 ml  Output    350 ml  Net    270 ml   Physical exam Pt is alert, oriented x3, in no acute resp distress BP 90/60  HR 82  RR 17 O2 sat 100% on [facility]  afebrile HEET: no JVD CV regular rhythm, S1 and S2, no murmurs Lungs and chest: clear breath sounds bilat, no crackles or wheezes. HD cath in the L subclavian with no erythema, discharge or bleeding. Abdomen: soft, non tender, non distended. BS active - Extrem: no low extrem edema, no calf tenderness.  Neurol: no focal deficit.   radiology    LAB RESULT Lab Results  Component Value Date   CREATININE 5.21* 05/28/2011   BUN 15 05/28/2011   NA 144 05/28/2011   K 3.4* 05/28/2011   CL 107 05/28/2011   CO2 28 05/28/2011   Lab Results  Component Value Date   WBC 2.2* 05/28/2011   HGB 8.0* 05/28/2011   HCT 24.6* 05/28/2011   MCV 92.5 05/28/2011   PLT 137*  05/28/2011   Lab Results  Component Value Date   ALT 23 05/28/2011   AST 78* 05/28/2011   ALKPHOS 192* 05/28/2011   BILITOT 0.9 05/28/2011   Lab Results  Component Value Date   INR 1.11 05/28/2011     Assessment and Plan  25yo F history of ESRD secondary to PCKD on HD who presented with fevers. Blood cx drawn at that time growing GNR. Hemodynamically stable.  1. Gram negative sepsis -- HD catheter likley needs to be removed, will cx first -- broad specturm ABX with double coverage until speciation -- appropriate cx sent - cont Zosyn, Tobramycin, Vancomycin - Leukopenic, getting better with antibiotics - Afebrile in the MICU - Follow CBC,  - Will consider echo  2.  ESRD (end stage renal disease) on dialysis, Polycystic kidney disease, congenital -- no acute issues, renal aware - Follow BMP, electrolytes  3. Chronic pancreatitis -- replacement enzymes  4. Best Practices -- SQH -- PPI  FULL CODE  HULETT,CIDNEY 05/29/2011, 5:38 AM

## 2011-05-29 NOTE — Progress Notes (Signed)
Critical Lab results WBC  1.4

## 2011-05-29 NOTE — Progress Notes (Addendum)
Monica Guerra is a 25 y.o. female with PMH of ESRD secondary to PCKD   of admitted on 05/28/2011 with with fevers and blood cx + for GNR   SUBJECTIVE: Pt denies complaints, feels better today than yesterday.   OBJECTIVE:  LINES / TUBES Chronic L chest wall HD Cath>>>  CULTURES 11/16 BCx2 (R arm)>>> 11/17 BCx2 (L FA)>>> 11/16 MRSA PCR>>>neg 11/17 BCx1 (from line)  ABX 11/17 Zosyn>>> 11/17 Tobra (with HD)>>> 11/17 Vanco>>>  BEST PRACITICE GI: protonix DVT: heparin sq Nutrition: PO  PROTOCOLS / CONSULTANTS  KEY EVENTS / STUDIES    Blood pressure 113/82, pulse 84, temperature 98.2 F (36.8 C), temperature source Oral, resp. rate 25, height 5\' 4"  (1.626 m), weight 159 lb 9.8 oz (72.4 kg), last menstrual period 04/23/2011, SpO2 96.00%.  General: chronically ill in NAD Neuro: AAOx4, speech clear, MAE CV: s1s2 rrr, no m/r/g PULM: resp's even/non-labored, lungs bilaterally GI: round / soft, bsx4 ative Extremities: warm, dry     Intake/Output Summary (Last 24 hours) at 05/29/11 1453 Last data filed at 05/29/11 1300  Gross per 24 hour  Intake   1020 ml  Output    850 ml  Net    170 ml      LABS CBC    Component Value Date/Time   WBC 1.4* 05/29/2011 0630   RBC 2.63* 05/29/2011 0630   HGB 7.9* 05/29/2011 0630   HCT 24.7* 05/29/2011 0630   PLT 116* 05/29/2011 0630   MCV 93.9 05/29/2011 0630   MCH 30.0 05/29/2011 0630   MCHC 32.0 05/29/2011 0630   RDW 16.4* 05/29/2011 0630   LYMPHSABS 0.3* 05/28/2011 2105   MONOABS 0.4 05/28/2011 2105   EOSABS 0.0 05/28/2011 2105   BASOSABS 0.0 05/28/2011 2105    BMET    Component Value Date/Time   NA 142 05/29/2011 0630   K 4.9 05/29/2011 0630   CL 107 05/29/2011 0630   CO2 26 05/29/2011 0630   GLUCOSE 86 05/29/2011 0630   BUN 22 05/29/2011 0630   CREATININE 5.92* 05/29/2011 0630   CALCIUM 9.1 05/29/2011 0630   GFRNONAA 9* 05/29/2011 0630   GFRAA 11* 05/29/2011 0630      ASSESSMENT/PLAN:   1. Gram  negative sepsis-from renal center.  Followed by Dr. Fausto Skillern in [place].  PLAN: - HD catheter may need to be removed, will cx first (first cultured from PIV), if unable to quantify culture count (to confirm infection from line) and if not then would need ID to evaluate prior to calling vascular to change line. - 11/17 bcx1 from HD cath to assist in determining if line related - broad specturm ABX with double coverage until speciation  - cont Zosyn, Tobramycin, Vancomycin  (vanco next dose will be due 11/19, received one dose in ED on admit, not actively ordered, follow cultures, if appropriate will need to be reordered (hopefully by [facility] cultures will have matured) - Leukopenic, getting better with antibiotics  - Afebrile currently - Follow CBC,  - 11/17 bcx1 from HD cath to assist in determining if line related  2. ESRD (end stage renal disease) on dialysis, Polycystic kidney disease, congenital  PLAN: - no acute issues, renal following - Follow BMP, electrolytes   3. Chronic pancreatitis  - replacement enzymes   4. Best Practices  - SQH  - PPI    5. Disposition- tx to SDU & to [facility] Team 8   FULL CODE  Canary Brim, NP-C Pager:  [phone] 05/29/2011, 2:53 PM  Patient seen  and examined, agree with above note.  I dictated the care and orders written for this patient under my direction.  Koren Bound, M.D.

## 2011-05-29 NOTE — Progress Notes (Signed)
ANTIBIOTIC CONSULT NOTE - INITIAL  Pharmacy Consult for Tobramycin Indication: GNR in 1/4 blood cx at OSH; neutropenic  Allergies  Allergen Reactions  . Vancomycin Itching    Patient usually takes Benadryl Before.     Patient Measurements: Height: 5\' 4"  (162.6 cm) Weight: 159 lb 9.8 oz (72.4 kg) IBW/kg (Calculated) : 54.7    Vital Signs: Temp: 97.9 F (36.6 C) (11/17 0008) Temp src: Oral (11/17 0008) BP: 87/41 mmHg (11/16 2300) Pulse Rate: 56  (11/16 2300) Intake/Output from previous day: 11/16 0701 - 11/17 0700 In: 620 [P.O.:620] Out: 350 [Urine:350] Intake/Output from this shift: Total I/O In: 120 [P.O.:120] Out: -   Labs:  [facility] 05/28/11 2105 05/28/11 1344  WBC 2.2* 0.9*  HGB 8.0* 8.6*  PLT 137* 148*  LABCREA -- --  CREATININE 5.21* 4.47*   Estimated Creatinine Clearance: 16.4 ml/min (by C-G formula based on Cr of 5.21).  Microbiology: Recent Results (from the past 720 hour(s))  CULTURE, BLOOD (ROUTINE X 2)     Status: Normal   Collection Time   05/14/11  6:00 PM      Component Value Range Status Comment   Specimen Description BLOOD LEFT ARM   Final    Special Requests BOTTLES DRAWN AEROBIC AND ANAEROBIC 6CC   Final    Culture NO GROWTH 5 DAYS   Final    Report Status 05/19/2011 FINAL   Final   CULTURE, BLOOD (ROUTINE X 2)     Status: Normal   Collection Time   05/14/11  6:30 PM      Component Value Range Status Comment   Specimen Description BLOOD RIGHT ARM   Final    Special Requests BOTTLES DRAWN AEROBIC AND ANAEROBIC 6CC   Final    Culture NO GROWTH 5 DAYS   Final    Report Status 05/19/2011 FINAL   Final   URINE CULTURE     Status: Normal   Collection Time   05/14/11  6:43 PM      Component Value Range Status Comment   Specimen Description URINE, CLEAN CATCH   Final    Special Requests NONE   Final    Setup Time [id number]   Final    Colony Count NO GROWTH   Final    Culture NO GROWTH   Final    Report Status 05/15/2011 FINAL   Final     URINE CULTURE     Status: Normal   Collection Time   05/15/11  1:04 AM      Component Value Range Status Comment   Specimen Description URINE, CLEAN CATCH   Final    Special Requests NONE   Final    Setup Time [id number]   Final    Colony Count NO GROWTH   Final    Culture NO GROWTH   Final    Report Status 05/16/2011 FINAL   Final   MRSA PCR SCREENING     Status: Normal   Collection Time   05/15/11  1:12 AM      Component Value Range Status Comment   MRSA by PCR NEGATIVE  NEGATIVE  Final   CLOSTRIDIUM DIFFICILE BY PCR     Status: Normal   Collection Time   05/15/11  1:15 AM      Component Value Range Status Comment   C difficile by pcr NEGATIVE  NEGATIVE  Final   CULTURE, BLOOD (ROUTINE X 2)     Status: Normal (Preliminary result)   Collection Time  05/28/11  1:58 PM      Component Value Range Status Comment   Specimen Description Blood BLOOD LEFT FOREARM   Final    Special Requests BOTTLES DRAWN AEROBIC AND ANAEROBIC 4CC EACH   Final    Culture PENDING   Incomplete    Report Status PENDING   Incomplete   MRSA PCR SCREENING     Status: Normal   Collection Time   05/28/11  8:04 PM      Component Value Range Status Comment   MRSA by PCR NEGATIVE  NEGATIVE  Final     Medical History: Past Medical History  Diagnosis Date  . Renal failure   . Polycystic kidney disease   . Chronic renal failure   . Hemodialysis status   . Anemia   . Congenital abnormalities     Orofacial digital syndrome    Medications:  Prescriptions prior to admission  Medication Sig Dispense Refill  . acetaminophen (TYLENOL) 500 MG tablet Take 1,000 mg by mouth every 6 (six) hours as needed. Pain       . amLODipine (NORVASC) 5 MG tablet Take 5 mg by mouth daily.        . furosemide (LASIX) 20 MG tablet Take 20 mg by mouth 2 (two) times daily.        . lipase/protease/amylase (CREON-10/PANCREASE) 12000 UNITS CPEP Take 2 capsules by mouth 3 (three) times daily with meals.  270 capsule  0  .  omeprazole (PRILOSEC) 20 MG capsule Take 20 mg by mouth daily. Upset Stomach      . Probiotic Product (PROBIOTIC FORMULA PO) Take 1 tablet by mouth daily.         Assessment: 25 y.o. female presents with fever x 2 days. Blood cx 2 days ago show GNR in 1/4 bottles. Pt also neutropenic. Pt is ESRD on HD M/W/F.  Goal of Therapy:  Tobramycin peak ~ 54mcg/ml and trough < 2 mcg/ml  Plan:  1. Tobramycin 170mg  x 1 now then 130 mg past each HD. 2. F/u microbiological data and HD schedule. 3. Consider broadening antibiotics until microbiological data is final  Lavonia Dana 05/29/2011,4:00 AM

## 2011-05-30 LAB — CBC
HCT: 25 % — ABNORMAL LOW (ref 36.0–46.0)
Hemoglobin: 8.1 g/dL — ABNORMAL LOW (ref 12.0–15.0)
MCV: 93.3 fL (ref 78.0–100.0)
RBC: 2.68 MIL/uL — ABNORMAL LOW (ref 3.87–5.11)
WBC: 1.4 10*3/uL — CL (ref 4.0–10.5)

## 2011-05-30 LAB — BASIC METABOLIC PANEL
CO2: 22 mEq/L (ref 19–32)
Chloride: 104 mEq/L (ref 96–112)
Sodium: 138 mEq/L (ref 135–145)

## 2011-05-30 LAB — LEGIONELLA ANTIGEN, URINE: Legionella Antigen, Urine: NEGATIVE

## 2011-05-30 MED ORDER — LIDOCAINE-PRILOCAINE 2.5-2.5 % EX CREA
1.0000 "application " | TOPICAL_CREAM | CUTANEOUS | Status: DC | PRN
  Filled 2011-05-30: qty 5

## 2011-05-30 MED ORDER — SODIUM CHLORIDE 0.9 % IJ SOLN
INTRAMUSCULAR | Status: AC
  Filled 2011-05-30: qty 10

## 2011-05-30 MED ORDER — NEPRO/CARBSTEADY PO LIQD
237.0000 mL | ORAL | Status: DC | PRN
  Filled 2011-05-30: qty 237

## 2011-05-30 MED ORDER — ALTEPLASE 2 MG IJ SOLR
2.0000 mg | Freq: Once | INTRAMUSCULAR | Status: AC | PRN
  Filled 2011-05-30: qty 2

## 2011-05-30 MED ORDER — HEPARIN SODIUM (PORCINE) 1000 UNIT/ML DIALYSIS
20.0000 [IU]/kg | INTRAMUSCULAR | Status: DC | PRN
  Administered 2011-05-31: 1400 [IU] via INTRAVENOUS_CENTRAL

## 2011-05-30 MED ORDER — METRONIDAZOLE 500 MG PO TABS
500.0000 mg | ORAL_TABLET | Freq: Three times a day (TID) | ORAL | Status: DC
  Administered 2011-05-30 – 2011-05-31 (×3): 500 mg via ORAL
  Filled 2011-05-30 (×6): qty 1

## 2011-05-30 MED ORDER — SODIUM CHLORIDE 0.9 % IV SOLN
100.0000 mL | INTRAVENOUS | Status: DC | PRN
  Administered 2011-06-04: 18:00:00 via INTRAVENOUS

## 2011-05-30 MED ORDER — PENTAFLUOROPROP-TETRAFLUOROETH EX AERO
1.0000 "application " | INHALATION_SPRAY | CUTANEOUS | Status: DC | PRN
  Filled 2011-05-30: qty 103.5

## 2011-05-30 MED ORDER — FENTANYL CITRATE 0.05 MG/ML IJ SOLN
50.0000 ug | Freq: Once | INTRAMUSCULAR | Status: AC
  Administered 2011-05-31: 50 ug via INTRAVENOUS

## 2011-05-30 MED ORDER — SODIUM CHLORIDE 0.9 % IJ SOLN
INTRAMUSCULAR | Status: AC
  Administered 2011-05-30: 10 mL
  Filled 2011-05-30: qty 10

## 2011-05-30 MED ORDER — LIDOCAINE HCL (PF) 1 % IJ SOLN: 5.0000 mL | INTRAMUSCULAR | Status: DC | PRN

## 2011-05-30 MED ORDER — HEPARIN SODIUM (PORCINE) 1000 UNIT/ML DIALYSIS
1000.0000 [IU] | INTRAMUSCULAR | Status: DC | PRN
  Administered 2011-06-07: 1000 [IU] via INTRAVENOUS_CENTRAL
  Filled 2011-05-30: qty 1

## 2011-05-30 MED ORDER — SODIUM CHLORIDE 0.9 % IV SOLN: 100.0000 mL | INTRAVENOUS | Status: DC | PRN

## 2011-05-30 NOTE — Consults (Addendum)
Monica Guerra 05/30/2011 Chelcea Zahn D Requesting Physician:  Dr. Sharon Seller   Chief Complaint:   In hospital for gram negative sepsis.  Needs to be seen for inpatient dialysis. HPI: The patient is a 25 y.o. year-old with polycystic kidney disease on dialysis via tunnelled catheter for 5 weeks now.  She is being considered for PD cath placement at [facility] to eventually do PD.  She presented with a 2day hx of fevers.  They started last week at dialysis and blood cultures were drawn and antibiotics given.  The cultures are growing positive for gram neg rods.  The patient required admission after labs showed low WBC of 0.9 and fevers were persisting.  She has defervesced, the catheter is still in and she has no complaints. Her usual HD schedule is MWF with Dr. Fausto Skillern in [place].  She denies any cough, chest pain, abd pain.  She did develop some diarrhea in the hospital, but that is improving.    Past Medical History:  Past Medical History  Diagnosis Date  . Renal failure   . Polycystic kidney disease   . Chronic renal failure   . Hemodialysis status   . Anemia   . Congenital abnormalities     Orofacial digital syndrome    Past Surgical History:  Past Surgical History  Procedure Date  . Central venous catheter insertion     left chest  . Cleft palate repair     Family History: History reviewed. No pertinent family history. Social History:  reports that she has never smoked. She does not have any smokeless tobacco history on file. She reports that she does not drink alcohol or use illicit drugs.  Allergies:  Allergies  Allergen Reactions  . Vancomycin Itching    Patient usually takes Benadryl Before.     Home medications: Prior to Admission medications   Medication Sig Start Date End Date Taking? Authorizing Provider  acetaminophen (TYLENOL) 500 MG tablet Take 1,000 mg by mouth every 6 (six) hours as needed. Pain    Yes Historical Provider, MD  amLODipine (NORVASC)  5 MG tablet Take 5 mg by mouth daily.     Yes Historical Provider, MD  furosemide (LASIX) 20 MG tablet Take 20 mg by mouth 2 (two) times daily.     Yes Historical Provider, MD  lipase/protease/amylase (CREON-10/PANCREASE) 12000 UNITS CPEP Take 2 capsules by mouth 3 (three) times daily with meals. 05/16/11  Yes Corinna L Sullivan  omeprazole (PRILOSEC) 20 MG capsule Take 20 mg by mouth daily. Upset Stomach   Yes Historical Provider, MD  Probiotic Product (PROBIOTIC FORMULA PO) Take 1 tablet by mouth daily.     Yes Historical Provider, MD    Inpatient medications:    . heparin  5,000 Units Subcutaneous Q8H  . lipase/protease/amylase  2 capsule Oral TID WC  . metroNIDAZOLE  500 mg Oral Q8H  . pantoprazole  40 mg Oral Q1200  . piperacillin-tazobactam (ZOSYN)  IV  2.25 g Intravenous Q8H  . sodium chloride      . sodium chloride      . tobramycin  132 mg Intravenous Q M,W,F-HD  . DISCONTD: sodium chloride        Review of Systems Gen:  Denies headache, chills, sweats.  No weight loss. HEENT:  No visual change, sore throat, difficulty swallowing. Resp:  No difficulty breathing, DOE.  No cough or hemoptysis. Cardiac:  No chest pain, orthopnea, PND.  Denies edema. GI:   Denies abdominal pain.   No nausea,  vomiting, diarrhea.  No constipation. GU:  Denies difficulty or change in voiding habits.   MS:  Denies joint pain or swelling.   Derm:  Denies skin rash or itching.  No chronic skin conditions.  Neuro:   Denies focal weakness, memory problems, hx stroke or TIA.   Psych:  Denies symptoms of depression of anxiety.  No hallucination.    Physical Exam:  Blood pressure 123/65, pulse 68, temperature 98.6 F (37 C), temperature source Oral, resp. rate 21, height 5\' 4"  (1.626 m), weight 71.7 kg (158 lb 1.1 oz), last menstrual period 04/23/2011, SpO2 95.00%.  Gen: alert, pleasant young female in no distress Skin: warm and dry Neck: no JVD or meningismus Chest: clear bilaterally Heart: reg  without rub or gallop Abdomen: soft, nontender Ext: left chest exit site of TDC is clean and dry Neuro: alert, Ox3 Heme/Lymph: no LAN or bruising Psych: calm and appropriate  Labs: Basic Metabolic Panel:  Lab 05/30/11 1308 05/29/11 0630 05/28/11 2105 05/28/11 1344  NA 138 142 -- 144  K 4.7 4.9 -- 3.4*  CL 104 107 -- 107  CO2 22 26 -- 28  GLUCOSE 81 86 -- 91  BUN 30* 22 -- 15  CREATININE 7.40* 5.92* 5.21* 4.47*  ALB -- -- -- --  CALCIUM 9.2 9.1 -- 9.6  PHOS -- -- 2.7 --   Liver Function Tests:  Lab 05/28/11 1344  AST 78*  ALT 23  ALKPHOS 192*  BILITOT 0.9  PROT 5.8*  ALBUMIN 2.9*    Lab 05/28/11 2105 05/28/11 1344  LIPASE -- 28  AMYLASE 43 --   No results found for this basename: AMMONIA:3 in the last 168 hours CBC:  Lab 05/30/11 0525 05/29/11 0630 05/28/11 2105 05/28/11 1344  WBC 1.4* 1.4* 2.2* 0.9*  NEUTROABS -- -- 1.5* 0.7*  HGB 8.1* 7.9* 8.0* 8.6*  HCT 25.0* 24.7* 24.6* 26.4*  MCV 93.3 93.9 92.5 92.3  PLT 133* 116* 137* 148*   PT/INR: @labrcntip (inr:5) Cardiac Enzymes:  Lab 05/29/11 1749 05/29/11 0800 05/29/11 0020  CKTOTAL 10 10 12   CKMB 1.4 1.4 1.4  CKMBINDEX -- -- --  TROPONINI <0.30 <0.30 <0.30   CBG:  Lab 05/28/11 2033  GLUCAP 127*    Iron Studies: No results found for this basename: IRON:30,TIBC:30,TRANSFERRIN:30,FERRITIN:30 in the last 168 hours  Xrays/Other Studies: Portable Chest Xray In Am  05/29/2011  *RADIOLOGY REPORT*  Clinical Data: Shortness of breath.  Neutropenic fever.  PORTABLE CHEST - 1 VIEW  Comparison: 05/28/2011  Findings: Stable appearance of the dialysis catheter noted with the distal tip projecting over the right mediastinal margin with a transverse orientation.  Borderline cardiomegaly noted.  There is linear subsegmental atelectasis in the lingula, but otherwise the lungs appear clear.  IMPRESSION:  1.  Stable distal orientation of the dialysis catheter. 2.  Lingular subsegmental atelectasis. 3.  Borderline  cardiomegaly.  Original Report Authenticated By: Dellia Cloud, M.D.    Assessment/Plan 1) ESRD- new to dialysis 5 weeks ago.  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  Will dialyze MWF in the hospital.  3.5 hours. 2)  Sepsis-  Outpatient cultures reportedly growing GNR's.  On abx per primary team.  Consideration to removing HD catheter may be needed depending on isolate and clinical course. 3)  Volume -- no volume overload.  Usual UF per pt is about 1.8 liters. 4)  Leukopenia -  Secondary to sepsis.   5)  HTN - amlodipine. 6)  Anemia -- need to get HD meds tomorrow from patient's  outpt unit.  Thanks for the referral.  Will follow.  Hemodialysis tomorrow.   Jessamine Barcia D 05/30/2011, 6:42 PM Cell #  [phone]

## 2011-05-30 NOTE — Progress Notes (Addendum)
Subjective:  ASSUMPTION OF CARE NOTE  Monica Guerra is a 25 y.o. 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   I have reviewed her history and database in detail.  [facility] is assuming care of this patient as of today.  Currently she is resting comfortably in bed.  She denies f/c, sob, cp.  She c/o watery diarrhea for 24hrs wch has kept her from sleeping.  She denies bloody stools.  She reports that she has crampy abdom pain that is not localizing - "like a belly ache."  Objective: Weight change: 1.8 kg (3 lb 15.5 oz)  Intake/Output Summary (Last 24 hours) at 05/30/11 0930 Last data filed at 05/30/11 0800  Gross per 24 hour  Intake    649 ml  Output    500 ml  Net    149 ml   Blood pressure 113/71, pulse 68, temperature 98.4 F (36.9 C), temperature source Oral, resp. rate 23, height 5\' 4"  (1.626 m), weight 71.7 kg (158 lb 1.1 oz), last menstrual period 04/23/2011, SpO2 98.00%. Temp:  [98.2 F (36.8 C)-98.8 F (37.1 C)] 98.4 F (36.9 C) (11/18 0757) Pulse Rate:  [67-103] 68  (11/18 0700) Resp:  [14-25] 23  (11/18 0900) BP: (95-123)/(48-82) 113/71 mmHg (11/18 0900) SpO2:  [93 %-98 %] 98 % (11/18 0800) Weight:  [71.7 kg (158 lb 1.1 oz)] 158 lb 1.1 oz (71.7 kg) (11/18 0500)  Physical Exam: General: No acute respiratory distress Lungs: Clear to auscultation bilaterally without wheezes or crackles Chest:  L subclacian area HD cath without erythema at insertion site Cardiovascular: Regular rate and rhythm without murmur gallop or rub normal S1 and S2 Abdomen: Nontender, nondistended, soft, bowel sounds positive, no rebound, no ascites, no appreciable mass Extremities: No significant cyanosis, clubbing, or edema bilateral lower extremities  Lab Results:  [facility] 05/30/11 0525 05/29/11 0630 05/28/11 2105 05/28/11 1344  NA 138 142 -- 144  K 4.7 4.9 -- 3.4*  CL 104 107 -- 107  CO2 22 26 -- 28  GLUCOSE 81 86 -- 91    BUN 30* 22 -- 15  CREATININE 7.40* 5.92* 5.21* --  CALCIUM 9.2 9.1 -- 9.6  MG -- -- 1.5 --  PHOS -- -- 2.7 --    Basename 05/28/11 1344  AST 78*  ALT 23  ALKPHOS 192*  BILITOT 0.9  PROT 5.8*  ALBUMIN 2.9*    Basename 05/28/11 2105 05/28/11 1344  LIPASE -- 28  AMYLASE 43 --    Basename 05/30/11 0525 05/29/11 0630 05/28/11 2105 05/28/11 1344  WBC 1.4* 1.4* 2.2* --  NEUTROABS -- -- 1.5* 0.7*  HGB 8.1* 7.9* 8.0* --  HCT 25.0* 24.7* 24.6* --  MCV 93.3 93.9 92.5 --  PLT 133* 116* 137* --    Basename 05/29/11 1749 05/29/11 0800 05/29/11 0020  CKTOTAL 10 10 12   CKMB 1.4 1.4 1.4  CKMBINDEX -- -- --  TROPONINI <0.30 <0.30 <0.30    Basename 05/28/11 2104  POCBNP 4026.0*    Basename 05/28/11 2105  DDIMER 6.38*   No results found for this basename: HGBA1C:12 in the last 72 hours No results found for this basename: CHOL:2,HDL:2,LDLCALC:2,TRIG:2,CHOLHDL:2,LDLDIRECT:2 in the last 72 hours No results found for this basename: TSH,T4TOTAL,FREET3,T3FREE,THYROIDAB in the last 72 hours No results found for this basename: VITAMINB12:2,FOLATE:2,FERRITIN:2,TIBC:2,IRON:2,RETICCTPCT:2 in the last 72 hours  Micro Results: Recent Results (from the past 240 hour(s))  CULTURE, BLOOD (ROUTINE X 2)  Status: Normal (Preliminary result)   Collection Time   05/28/11  1:58 PM      Component Value Range Status Comment   Specimen Description Blood BLOOD LEFT FOREARM   Final    Special Requests BOTTLES DRAWN AEROBIC AND ANAEROBIC 4CC EACH   Final    Culture NO GROWTH 2 DAYS   Final    Report Status PENDING   Incomplete   CULTURE, BLOOD (ROUTINE X 2)     Status: Normal (Preliminary result)   Collection Time   05/28/11  2:14 PM      Component Value Range Status Comment   Specimen Description BLOOD RIGHT ARM   Final    Special Requests BOTTLES DRAWN AEROBIC AND ANAEROBIC 6CC   Final    Culture NO GROWTH 2 DAYS   Final    Report Status PENDING   Incomplete   MRSA PCR SCREENING     Status:  Normal   Collection Time   05/28/11  8:04 PM      Component Value Range Status Comment   MRSA by PCR NEGATIVE  NEGATIVE  Final   CULTURE, BLOOD (SINGLE)     Status: Normal (Preliminary result)   Collection Time   05/29/11  4:25 PM      Component Value Range Status Comment   Specimen Description BLOOD HEMODIALYSIS CATHETER   Final    Special Requests BOTTLES DRAWN AEROBIC AND ANAEROBIC 5CC   Final    Setup Time [id number]   Final    Culture     Final    Value:        BLOOD CULTURE RECEIVED NO GROWTH TO DATE CULTURE WILL BE HELD FOR 5 DAYS BEFORE ISSUING A FINAL NEGATIVE REPORT   Report Status PENDING   Incomplete     Studies/Results: Scheduled Meds:   . heparin  5,000 Units Subcutaneous Q8H  . lipase/protease/amylase  2 capsule Oral TID WC  . pantoprazole  40 mg Oral Q1200  . piperacillin-tazobactam (ZOSYN)  IV  2.25 g Intravenous Q8H  . sodium chloride      . tobramycin  132 mg Intravenous Q M,W,F-HD   Continuous Infusions:   . sodium chloride 10 mL/hr at 05/30/11 0500   PRN Meds:.acetaminophen, albuterol, ondansetron (ZOFRAN) IV, ondansetron  Assessment/Plan:  Gram negative sepsis-from HD catheter no fever since 11/16-on emperic abx-  Neutropenia Follow trend-check diff in AM  ESRD (end stage renal disease) on dialysis PCCM contacted Nephrology to resume typical HD schedule  (M-W-F)  Polycystic kidney disease, congenital  HTN (hypertension) Well controlled at present   Chronic pancreatitis On replacement enzymes  Diarrhea Likely related to abx tx-pt denies that this is chronic despite diagnosis of chronic pancreatitis-will check C diff to be sure     LOS: 2 days   [name] 05/30/2011, 9:30 AM

## 2011-05-31 ENCOUNTER — Inpatient Hospital Stay (HOSPITAL_COMMUNITY): Payer: PRIVATE HEALTH INSURANCE

## 2011-05-31 LAB — DIFFERENTIAL
Basophils Absolute: 0 10*3/uL (ref 0.0–0.1)
Basophils Relative: 1 % (ref 0–1)
Eosinophils Absolute: 0 10*3/uL (ref 0.0–0.7)
Eosinophils Relative: 3 % (ref 0–5)
Metamyelocytes Relative: 0 %
Myelocytes: 0 %
Neutro Abs: 0.1 10*3/uL — ABNORMAL LOW (ref 1.7–7.7)
Neutrophils Relative %: 7 % — ABNORMAL LOW (ref 43–77)

## 2011-05-31 LAB — CBC
HCT: 26.7 % — ABNORMAL LOW (ref 36.0–46.0)
Hemoglobin: 8.6 g/dL — ABNORMAL LOW (ref 12.0–15.0)
MCH: 29.7 pg (ref 26.0–34.0)
MCV: 92.1 fL (ref 78.0–100.0)
RBC: 2.9 MIL/uL — ABNORMAL LOW (ref 3.87–5.11)

## 2011-05-31 LAB — BASIC METABOLIC PANEL
BUN: 41 mg/dL — ABNORMAL HIGH (ref 6–23)
GFR calc Af Amer: 7 mL/min — ABNORMAL LOW (ref >90)
GFR calc non Af Amer: 6 mL/min — ABNORMAL LOW (ref >90)
Potassium: 4.7 mEq/L (ref 3.5–5.1)

## 2011-05-31 LAB — HEPATITIS B SURFACE ANTIGEN: Hepatitis B Surface Ag: NEGATIVE

## 2011-05-31 MED ORDER — METRONIDAZOLE IN NACL 5-0.79 MG/ML-% IV SOLN
500.0000 mg | Freq: Three times a day (TID) | INTRAVENOUS | Status: DC
  Administered 2011-05-31 – 2011-06-01 (×5): 500 mg via INTRAVENOUS
  Filled 2011-05-31 (×7): qty 100

## 2011-05-31 MED ORDER — MORPHINE SULFATE 2 MG/ML IJ SOLN
1.0000 mg | INTRAMUSCULAR | Status: DC | PRN
  Administered 2011-06-02: 1 mg via INTRAVENOUS
  Administered 2011-06-04 – 2011-06-05 (×3): 2 mg via INTRAVENOUS
  Filled 2011-05-31: qty 1

## 2011-05-31 MED ORDER — FENTANYL CITRATE 0.05 MG/ML IJ SOLN
INTRAMUSCULAR | Status: AC
  Administered 2011-05-31: 50 ug via INTRAVENOUS
  Filled 2011-05-31: qty 2

## 2011-05-31 MED ORDER — PROMETHAZINE HCL 25 MG/ML IJ SOLN
12.5000 mg | INTRAMUSCULAR | Status: DC | PRN
  Administered 2011-05-31 (×2): 12.5 mg via INTRAVENOUS
  Filled 2011-05-31 (×3): qty 1

## 2011-05-31 NOTE — Progress Notes (Signed)
Subjective: Interval History: has no complaint of flagyl causing stomach upset, phenergan hurting at iv site.  Thinks she is on transplant list but never had visit at [facility].  Objective: Vital signs in last 24 hours: Temp:  [97.7 F (36.5 C)-98.6 F (37 C)] 98.6 F (37 C) (11/19 0800) Resp:  [14-25] 15  (11/19 0800) BP: (113-140)/(64-95) 127/84 mmHg (11/19 0800) SpO2:  [95 %-100 %] 98 % (11/19 0800) Weight:  [70.1 kg (154 lb 8.7 oz)] 154 lb 8.7 oz (70.1 kg) (11/19 0500) Weight change: -1.6 kg (-3 lb 8.4 oz)  Intake/Output from previous day: 11/18 0701 - 11/19 0700 In: 822 [P.O.:660; IV Piggyback:162] Out: 2650 [Urine:2350] Intake/Output this shift: Total I/O In: 40 [P.O.:40] Out: 301 [Urine:300; Stool:1]  General appearance: alert, cooperative, appears stated age and moderately obese Back: symmetric, no curvature. ROM normal. No CVA tenderness., presacral edema 1+ Extremities: edema 2+ depressed affect  Lab Results:  Basename 05/31/11 0530 05/30/11 0525  WBC 1.2* 1.4*  HGB 8.6* 8.1*  HCT 26.7* 25.0*  PLT 142* 133*   BMET:  Basename 05/31/11 0530 05/30/11 0525  NA 140 138  K 4.7 4.7  CL 103 104  CO2 19 22  GLUCOSE 94 81  BUN 41* 30*  CREATININE 8.86* 7.40*  CALCIUM 9.6 9.2   No results found for this basename: PTH:2 in the last 72 hours Iron Studies: No results found for this basename: IRON,TIBC,TRANSFERRIN,FERRITIN in the last 72 hours  Studies/Results: No results found.  Scheduled:   . fentaNYL  50 mcg Intravenous Once  . heparin  5,000 Units Subcutaneous Q8H  . lipase/protease/amylase  2 capsule Oral TID WC  . metroNIDAZOLE  500 mg Oral Q8H  . pantoprazole  40 mg Oral Q1200  . piperacillin-tazobactam (ZOSYN)  IV  2.25 g Intravenous Q8H  . sodium chloride      . sodium chloride      . tobramycin  132 mg Intravenous Q M,W,F-HD  . DISCONTD: sodium chloride        Assessment/Plan: 1) ESRD- new to dialysis 5 weeks ago.Will dialyze MWF in the  hospital.  Today 3.5 hours.  2) Sepsis- Outpatient cultures reportedly growing GNR's. On abx per primary team. Consideration to removing HD catheter may be needed depending on isolate and clinical course.  Need bacteriologic information. 3.) C.Difficile Infection 4) Leukopenia -  5) HTN - amlodipine.  6) Anemia -    LOS: 3 days   Khristopher Kapaun C 05/31/2011,8:57 AM

## 2011-05-31 NOTE — Progress Notes (Signed)
Subjective:  Monica Guerra is a 25 y.o. 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    This morning she is sitting up in a chair at the bedside. She is complaining of unrelenting nausea and has retched multiple times. There's been no hematemesis. There is still generalized crampy abdominal pain and watery diarrhea. Yesterday a C. difficile PCR sample returned positive, and oral Flagyl was initiated. She denies shortness of breath or chest pain.  Objective: Weight change: -1.6 kg (-3 lb 8.4 oz)  Intake/Output Summary (Last 24 hours) at 05/31/11 1155 Last data filed at 05/31/11 0800  Gross per 24 hour  Intake    622 ml  Output   1951 ml  Net  -1329 ml   Blood pressure 137/69, pulse 140, temperature 97.4 F (36.3 C), temperature source Oral, resp. rate 24, height 5\' 4"  (1.626 m), weight 70.1 kg (154 lb 8.7 oz), last menstrual period 04/23/2011, SpO2 96.00%. Temp:  [97.4 F (36.3 C)-98.6 F (37 C)] 97.4 F (36.3 C) (11/19 1035) Pulse Rate:  [72-140] 140  (11/19 1130) Resp:  [14-25] 24  (11/19 1130) BP: (116-140)/(64-95) 137/69 mmHg (11/19 1130) SpO2:  [95 %-100 %] 96 % (11/19 1035) Weight:  [70.1 kg (154 lb 8.7 oz)] 154 lb 8.7 oz (70.1 kg) (11/19 1035)  Physical Exam: General: No acute respiratory distress Lungs: Clear to auscultation bilaterally without wheezes or crackles Chest: subclavian area HD cath without erythema at insertion site Cardiovascular: Regular rate and rhythm without murmur gallop or rub normal S1 and S2 Abdomen: Mildly tender diffusely, nondistended, soft, bowel sounds positive, no rebound, no ascites, no appreciable mass Extremities: No significant cyanosis, clubbing, or edema bilateral lower extremities  Lab Results:  Basename 05/31/11 0530 05/30/11 0525 05/29/11 0630 05/28/11 2105  NA 140 138 142 --  K 4.7 4.7 4.9 --  CL 103 104 107 --  CO2 19 22 26  --  GLUCOSE  94 81 86 --  BUN 41* 30* 22 --  CREATININE 8.86* 7.40* 5.92* --  CALCIUM 9.6 9.2 9.1 --  MG -- -- -- 1.5  PHOS -- -- -- 2.7    Basename 05/28/11 1344  AST 78*  ALT 23  ALKPHOS 192*  BILITOT 0.9  PROT 5.8*  ALBUMIN 2.9*    Basename 05/28/11 2105 05/28/11 1344  LIPASE -- 28  AMYLASE 43 --    Basename 05/31/11 0530 05/30/11 0525 05/29/11 0630 05/28/11 2105 05/28/11 1344  WBC 1.2* 1.4* 1.4* -- --  NEUTROABS 0.1* -- -- 1.5* 0.7*  HGB 8.6* 8.1* 7.9* -- --  HCT 26.7* 25.0* 24.7* -- --  MCV 92.1 93.3 93.9 -- --  PLT 142* 133* 116* -- --    Basename 05/29/11 1749 05/29/11 0800 05/29/11 0020  CKTOTAL 10 10 12   CKMB 1.4 1.4 1.4  CKMBINDEX -- -- --  TROPONINI <0.30 <0.30 <0.30    Basename 05/28/11 2104  POCBNP 4026.0*    Basename 05/28/11 2105  DDIMER 6.38*   No results found for this basename: HGBA1C:12 in the last 72 hours No results found for this basename: CHOL:2,HDL:2,LDLCALC:2,TRIG:2,CHOLHDL:2,LDLDIRECT:2 in the last 72 hours No results found for this basename: TSH,T4TOTAL,FREET3,T3FREE,THYROIDAB in the last 72 hours No results found for this basename: VITAMINB12:2,FOLATE:2,FERRITIN:2,TIBC:2,IRON:2,RETICCTPCT:2 in the last 72 hours  Micro Results: Recent Results (from the past 240 hour(s))  CULTURE, BLOOD (ROUTINE X 2)     Status: Normal (Preliminary result)  Collection Time   05/28/11  1:58 PM      Component Value Range Status Comment   Specimen Description BLOOD LEFT FOREARM   Final    Special Requests BOTTLES DRAWN AEROBIC AND ANAEROBIC 4CC EACH   Final    Culture NO GROWTH 3 DAYS   Final    Report Status PENDING   Incomplete   CULTURE, BLOOD (ROUTINE X 2)     Status: Normal (Preliminary result)   Collection Time   05/28/11  2:14 PM      Component Value Range Status Comment   Specimen Description BLOOD RIGHT ARM   Final    Special Requests BOTTLES DRAWN AEROBIC AND ANAEROBIC 6CC   Final    Culture NO GROWTH 3 DAYS   Final    Report Status PENDING    Incomplete   MRSA PCR SCREENING     Status: Normal   Collection Time   05/28/11  8:04 PM      Component Value Range Status Comment   MRSA by PCR NEGATIVE  NEGATIVE  Final   CULTURE, BLOOD (SINGLE)     Status: Normal (Preliminary result)   Collection Time   05/29/11  4:25 PM      Component Value Range Status Comment   Specimen Description BLOOD HEMODIALYSIS CATHETER   Final    Special Requests BOTTLES DRAWN AEROBIC AND ANAEROBIC 5CC   Final    Setup Time [id number]   Final    Culture     Final    Value:        BLOOD CULTURE RECEIVED NO GROWTH TO DATE CULTURE WILL BE HELD FOR 5 DAYS BEFORE ISSUING A FINAL NEGATIVE REPORT   Report Status PENDING   Incomplete   CLOSTRIDIUM DIFFICILE BY PCR     Status: Abnormal   Collection Time   05/30/11 10:32 AM      Component Value Range Status Comment   C difficile by pcr POSITIVE (*) NEGATIVE  Final     Studies/Results: Scheduled Meds:    . fentaNYL  50 mcg Intravenous Once  . heparin  5,000 Units Subcutaneous Q8H  . lipase/protease/amylase  2 capsule Oral TID WC  . metroNIDAZOLE  500 mg Oral Q8H  . pantoprazole  40 mg Oral Q1200  . piperacillin-tazobactam (ZOSYN)  IV  2.25 g Intravenous Q8H  . sodium chloride      . sodium chloride      . tobramycin  132 mg Intravenous Q M,W,F-HD   Continuous Infusions:  PRN Meds:.sodium chloride, sodium chloride, acetaminophen, albuterol, alteplase, feeding supplement (NEPRO CARB STEADY), heparin, heparin, lidocaine, lidocaine-prilocaine, morphine injection, ondansetron (ZOFRAN) IV, ondansetron, pentafluoroprop-tetrafluoroeth, promethazine  Assessment/Plan:  Gram negative sepsis-from HD catheter no fever since 11/16-on emperic abx as initiated by critical care medicine-3 different blood cultures have been negative during this patient's hospital stay thus far-we will need to follow the blood cultures from her peripheral draws as well as from her dialysis catheter for 5 days to assure there is no  evidence of a lingering infection-nephrology is also following-we will elicit there opinion as to whether or not the dialysis catheter will need to be changed-at this time I suspect that the patient will not require this change-nephrology appears to be attempting to obtain medical records from the patient's outpatient dialysis  Neutropenia The patient's white count continues to decline-this is felt originally to be related to the patient's gram-negative rod bacteremia-her platelet count has improved today-I hope this is an indication that her marrow  is responding and that her neutropenia will soon begin to improve as well-if however her white blood cell count has not improved by tomorrow morning we will consult hematology for further recommendations-she will be placed on neutropenic precautions  ESRD (end stage renal disease) on dialysis PCCM contacted Nephrology to resume typical HD schedule  (M-W-F)  Polycystic kidney disease  HTN (hypertension) Reasonably well controlled at present-no change in treatment plan today  Chronic pancreatitis On replacement enzymes  C. difficile colitis Flagyl therapy has been initiated-we will need to follow to assure that she is responding-if she is not show signs of improvement we may need to broaden her C. difficile coverage-if she continues to vomit we may need to change to IV therapy  Disposition Though the patient remains too ill to be discharged from the hospital she is now deemed stable for transfer to a telemetry bed.    LOS: 3 days   [place] Current T 05/31/2011, 11:55 AM

## 2011-05-31 NOTE — Progress Notes (Signed)
UTILIZATION REVIEW COMPLETE Heidi Lemay Jones 05/31/2011 [phone] OR [phone]  

## 2011-06-01 DIAGNOSIS — D709 Neutropenia, unspecified: Secondary | ICD-10-CM

## 2011-06-01 DIAGNOSIS — D72819 Decreased white blood cell count, unspecified: Secondary | ICD-10-CM | POA: Diagnosis not present

## 2011-06-01 LAB — DIFFERENTIAL
Basophils Absolute: 0 10*3/uL (ref 0.0–0.1)
Eosinophils Absolute: 0.1 10*3/uL (ref 0.0–0.7)
Lymphocytes Relative: 60 % — ABNORMAL HIGH (ref 12–46)
Neutrophils Relative %: 3 % — ABNORMAL LOW (ref 43–77)

## 2011-06-01 LAB — BASIC METABOLIC PANEL
BUN: 21 mg/dL (ref 6–23)
Calcium: 9.1 mg/dL (ref 8.4–10.5)
GFR calc Af Amer: 10 mL/min — ABNORMAL LOW (ref >90)
GFR calc non Af Amer: 9 mL/min — ABNORMAL LOW (ref >90)
Potassium: 3.8 mEq/L (ref 3.5–5.1)
Sodium: 138 mEq/L (ref 135–145)

## 2011-06-01 LAB — CBC
MCHC: 32.8 g/dL (ref 30.0–36.0)
Platelets: 136 10*3/uL — ABNORMAL LOW (ref 150–400)
RDW: 15.3 % (ref 11.5–15.5)

## 2011-06-01 MED ORDER — DEXTROSE 5 % IV SOLN
1.0000 g | INTRAVENOUS | Status: AC
  Administered 2011-06-01: 1 g via INTRAVENOUS
  Filled 2011-06-01: qty 1

## 2011-06-01 MED ORDER — HEPARIN SODIUM (PORCINE) 1000 UNIT/ML DIALYSIS
20.0000 [IU]/kg | INTRAMUSCULAR | Status: DC | PRN
  Filled 2011-06-01: qty 2

## 2011-06-01 MED ORDER — DARBEPOETIN ALFA-POLYSORBATE 150 MCG/0.3ML IJ SOLN
150.0000 ug | INTRAMUSCULAR | Status: DC
  Administered 2011-06-02: 150 ug via INTRAVENOUS
  Filled 2011-06-01: qty 0.3

## 2011-06-01 MED ORDER — CIPROFLOXACIN IN D5W 400 MG/200ML IV SOLN
400.0000 mg | INTRAVENOUS | Status: DC
  Filled 2011-06-01: qty 200

## 2011-06-01 MED ORDER — DEXTROSE 5 % IV SOLN
2.0000 g | INTRAVENOUS | Status: DC
  Administered 2011-06-02: 2 g via INTRAVENOUS
  Filled 2011-06-01 (×3): qty 2

## 2011-06-01 NOTE — Consult Note (Signed)
Monica Cove    MD requesting consult: [facility] Reason for consult: Neutropenia ZOX:WRUE 25 year old [place], [place]  woman with a history of ESRD secondary to  kidney cystic disease transferred from [facility] on 05/28/2011 after found to have GNR bacteremia  likely from a left subclavian central catheter used for hemodialysis, requiring empirical antibiotics. She had fever (tmax 103 F), chills, myalgia and non-productive cough. Workup included a CBC with differential revealing significant leukopenia with a WBC of 0.9. Repeat WBC in the evening of 11/16 showed there WBC to be 2.2 with ANC 1.5 dropping again on 11/17 to 1.4 (no diff obtained). Marland Kitchen Antibiotics included 11/17 Zosyn,Tobra (with HD),and Vanco. WBC continued to show a drop. Important to mention that her status was complicated by diagnosis of C difficile by PCR. On 11/19 were 1.2 with ANC 0.1. No Neupogen was used to date. She was on neutropenic precautions.Today however, her WBC shows improvement to 1.7 but ANC still 0.1 Blood smear demonstrates mild left shift, likely secondary to infection.   Of note , while  H/H dropped from  H/H was 8.0/24.6 to 7.9/24.7,counts began to recover thereafter. Her platelets were 116k at their lowest, eventually rising to 142k on 11/19.  Past Medical History  Diagnosis Date  . Renal failure   . Polycystic kidney disease   . Chronic renal failure   . Hemodialysis status   . Anemia   . Congenital abnormalities     Orofacial digital syndrome    Past Surgical History  Procedure Date  . Central venous catheter insertion     left chest  . 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 No pertinent family history for malignancy.  Gynecologic history:Menarche age 33. LMP 04/21/11. Duration 5 days. Never been pregnant. Not sexually active.   Social History:  reports that she has never smoked. She does not have any smokeless tobacco history on file. She  reports that she does not drink alcohol or use illicit drugs.  Patient  Is single, lives with parents. Does not work outside home.  Originally from [place]. Health maintenance:  Cholesterol : never checked  Bone density :never checked  Colonoscopy: never had  (PAP) : never had  Allergies:  Allergies  Allergen Reactions  . Vancomycin Itching    Patient usually takes Benadryl Before.        . heparin  5,000 Units Subcutaneous Q8H  . lipase/protease/amylase  2 capsule Oral TID WC  . metronidazole  500 mg Intravenous Q8H  . pantoprazole  40 mg Oral Q1200  . piperacillin-tazobactam (ZOSYN)  IV  2.25 g Intravenous Q8H  . tobramycin  132 mg Intravenous Q M,W,F-HD  . DISCONTD: metroNIDAZOLE  500 mg Oral Q8H    ROS  Constitutional:  Fever, chills  on admission, now resolved. Positive malaise/fatigue 4 days prior to admission.  Eyes: Negative for blurred vision and double vision.  Respiratory: Non productive cough, no  hemoptysis and shortness of breath. L  Cath NT Cardiovascular: negative for chest pain.  GI: No nausea, vomiting. Had diarrhea on admission which is improving. No change in bowel caliber. No Melena or Hematochezia. Had diffuse abdominal tenderness from recent acute on chronic pancreatitis followed by Dr. Matthias Hughs  GU: No blood in urine. No loss of urinary control.  Skin: Negative for itching. No rash. No petechia. No bruising  Neurological: No headaches. No motor or sensory deficits.  Musculoskeletal: chronic back pain. Denies pain prior to  fracture. Was fairly active and independent. Rest of the ROS is negative  Physical Exam:  Blood pressure 107/66, pulse 100, temperature 99.3 F (37.4 C), temperature source Oral, resp. rate 24, height 4\' 11"  (1.499 m), weight 153 lb 7 oz (69.6 kg), last menstrual period 04/23/2011, SpO2 93.00%. General: 25 y/o wf NAD AOx3  HEENT: Sclerae unicteric Oropharynx clear. No mucositis or thrush No peripheral adenopathy Lungs no rales  or rhonchi or wheeezing Heart regular rate and rhythm Abdomen soft, slightly diffusely tender with deep palpation. No organomegaly or masses noted MSK no focal spinal tenderness, no peripheral edema Neuro: nonfocal Breasts: not examined  LABS   Results for orders placed during the hospital encounter of 05/28/11 (from the past 48 hour(s))  CLOSTRIDIUM DIFFICILE BY PCR     Status: Abnormal   Collection Time   05/30/11 10:32 AM      Component Value Range Comment   C difficile by pcr POSITIVE (*) NEGATIVE    CBC     Status: Abnormal   Collection Time   05/31/11  5:30 AM      Component Value Range Comment   WBC 1.2 (*) 4.0 - 10.5 (K/uL) CRITICAL VALUE NOTED.  VALUE IS CONSISTENT WITH PREVIOUSLY REPORTED AND CALLED VALUE.   RBC 2.90 (*) 3.87 - 5.11 (MIL/uL)    Hemoglobin 8.6 (*) 12.0 - 15.0 (g/dL)    HCT 16.1 (*) 09.6 - 46.0 (%)    MCV 92.1  78.0 - 100.0 (fL)    MCH 29.7  26.0 - 34.0 (pg)    MCHC 32.2  30.0 - 36.0 (g/dL)    RDW 04.5  40.9 - 81.1 (%)    Platelets 142 (*) 150 - 400 (K/uL)   DIFFERENTIAL     Status: Abnormal   Collection Time   05/31/11  5:30 AM      Component Value Range Comment   Neutrophils Relative 7 (*) 43 - 77 (%)    Lymphocytes Relative 72 (*) 12 - 46 (%)    Monocytes Relative 17 (*) 3 - 12 (%)    Eosinophils Relative 3  0 - 5 (%)    Basophils Relative 1  0 - 1 (%)    Band Neutrophils 0  0 - 10 (%)    Metamyelocytes Relative 0      Myelocytes 0      Promyelocytes Absolute 0      Blasts 0      nRBC 0  0 (/100 WBC)    Neutro Abs 0.1 (*) 1.7 - 7.7 (K/uL)    Lymphs Abs 0.9  0.7 - 4.0 (K/uL)    Monocytes Absolute 0.2  0.1 - 1.0 (K/uL)    Eosinophils Absolute 0.0  0.0 - 0.7 (K/uL)    Basophils Absolute 0.0  0.0 - 0.1 (K/uL)    WBC Morphology ATYPICAL LYMPHOCYTES   MILD LEFT SHIFT (1-5% METAS, OCC MYELO, OCC BANDS)  BASIC METABOLIC PANEL     Status: Abnormal   Collection Time   05/31/11  5:30 AM      Component Value Range Comment   Sodium 140  135 - 145  (mEq/L)    Potassium 4.7  3.5 - 5.1 (mEq/L) SLIGHT HEMOLYSIS   Chloride 103  96 - 112 (mEq/L)    CO2 19  19 - 32 (mEq/L)    Glucose, Bld 94  70 - 99 (mg/dL)    BUN 41 (*) 6 - 23 (mg/dL)    Creatinine, Ser 9.14 (*) 0.50 -  1.10 (mg/dL)    Calcium 9.6  8.4 - 10.5 (mg/dL)    GFR calc non Af Amer 6 (*) >90 (mL/min)    GFR calc Af Amer 7 (*) >90 (mL/min)   HEPATITIS B SURFACE ANTIGEN     Status: Normal   Collection Time   05/31/11 11:11 AM      Component Value Range Comment   Hepatitis B Surface Ag NEGATIVE  NEGATIVE    CBC     Status: Abnormal   Collection Time   06/01/11  7:00 AM      Component Value Range Comment   WBC 1.7 (*) 4.0 - 10.5 (K/uL)    RBC 2.85 (*) 3.87 - 5.11 (MIL/uL)    Hemoglobin 8.4 (*) 12.0 - 15.0 (g/dL)    HCT 16.1 (*) 09.6 - 46.0 (%)    MCV 89.8  78.0 - 100.0 (fL)    MCH 29.5  26.0 - 34.0 (pg)    MCHC 32.8  30.0 - 36.0 (g/dL)    RDW 04.5  40.9 - 81.1 (%)    Platelets 136 (*) 150 - 400 (K/uL)   DIFFERENTIAL     Status: Abnormal   Collection Time   06/01/11  7:00 AM      Component Value Range Comment   Neutrophils Relative 3 (*) 43 - 77 (%)    Lymphocytes Relative 60 (*) 12 - 46 (%)    Monocytes Relative 28 (*) 3 - 12 (%)    Eosinophils Relative 7 (*) 0 - 5 (%)    Basophils Relative 2 (*) 0 - 1 (%)    Neutro Abs 0.1 (*) 1.7 - 7.7 (K/uL)    Lymphs Abs 1.0  0.7 - 4.0 (K/uL)    Monocytes Absolute 0.5  0.1 - 1.0 (K/uL)    Eosinophils Absolute 0.1  0.0 - 0.7 (K/uL)    Basophils Absolute 0.0  0.0 - 0.1 (K/uL)    WBC Morphology MILD LEFT SHIFT (1-5% METAS, OCC MYELO, OCC BANDS)   ATYPICAL LYMPHOCYTES  BASIC METABOLIC PANEL     Status: Abnormal   Collection Time   06/01/11  7:00 AM      Component Value Range Comment   Sodium 138  135 - 145 (mEq/L)    Potassium 3.8  3.5 - 5.1 (mEq/L)    Chloride 100  96 - 112 (mEq/L)    CO2 22  19 - 32 (mEq/L)    Glucose, Bld 80  70 - 99 (mg/dL)    BUN 21  6 - 23 (mg/dL)    Creatinine, Ser 9.14 (*) 0.50 - 1.10 (mg/dL)     Calcium 9.1  8.4 - 10.5 (mg/dL)    GFR calc non Af Amer 9 (*) >90 (mL/min)    GFR calc Af Amer 10 (*) >90 (mL/min)   PATHOLOGIST SMEAR REVIEW     Status: Normal   Collection Time   06/01/11  7:00 AM      Component Value Range Comment   Tech Review Severe absolute neutropenia             Assessment: :This 25 year old [place], [place]  woman found to have GNR bacteremia  likely from a left subclavian central catheter used for hemodialysis, as well as C difficile by PCR, requiring empirical antibiotics. Pt was noticed to have neutropenia, in the setting of infection,as well as polypharmacy  Plan: . Will evaluate her peripheral blood smear for abnormalities and plans are to proceed.   Thank you for the referral.  [facility] E 06/01/2011, 9:39 AM

## 2011-06-01 NOTE — Progress Notes (Signed)
Subjective: Interval History: Feels much better today. Tolerating eating crackers.  Objective: Vital signs in last 24 hours: Temp:  [98.7 F (37.1 Guerra)-100.7 F (38.2 Guerra)] 99.3 F (37.4 Guerra) (11/20 0459) Pulse Rate:  [84-140] 100  (11/20 0459) Resp:  [15-31] 24  (11/20 0459) BP: (107-143)/(60-82) 107/66 mmHg (11/20 0459) SpO2:  [93 %-99 %] 93 % (11/20 0459) Weight:  [68.9 kg (151 lb 14.4 oz)-69.6 kg (153 lb 7 oz)] 153 lb 7 oz (69.6 kg) (11/20 0459) Weight change: 0 kg (0 lb)  Intake/Output from previous day: 11/19 0701 - 11/20 0700 In: 150 [P.O.:100; IV Piggyback:50] Out: 3753 [Urine:750; Stool:3] Intake/Output this shift: Total I/O In: 480 [P.O.:480] Out: -   General appearance: alert and cooperative GI: soft, non-tender; bowel sounds normal; no masses,  no organomegaly Extremities: extremities normal, atraumatic, no cyanosis or edema, no av access Neurologic: Grossly normal Permcath right chest wall  Lab Results:  Basename 06/01/11 0700 05/31/11 0530  WBC 1.7* 1.2*  HGB 8.4* 8.6*  HCT 25.6* 26.7*  PLT 136* 142*   BMET:  Basename 06/01/11 0700 05/31/11 0530  NA 138 140  K 3.8 4.7  CL 100 103  CO2 22 19  GLUCOSE 80 94  BUN 21 41*  CREATININE 6.06* 8.86*  CALCIUM 9.1 9.6   No results found for this basename: PTH:2 in the last 72 hours Iron Studies: No results found for this basename: IRON,TIBC,TRANSFERRIN,FERRITIN in the last 72 hours Studies/Results: No results found. Cultures from [facility] reveal Enterobacter Cloacae sensitive to Cipro, ceftazidime, gent, cefepime, resistant to cefazolin  I have reviewed the patient's current medications.  Assessment 1) ESRD- new to dialysis 5 weeks ago.Will dialyze MWF in the hospital. Tomorrow next treatment.  Eventual Plan is for Peritoneal Dialysis.  2) Enterobacter cloacea Sepsis- Would stop gentamicin given potential ototoxicity and unasyn. Begin Cipro(done) ?catheter relater versus other  source 3.) Guerra.Difficile Infection  4) Leukopenia - per hematology 5) HTN - amlodipine.  6) Anemia -begin ESA Rx (aranep--done)     LOS: 4 days   Monica Guerra 06/01/2011,11:10 AM

## 2011-06-01 NOTE — Progress Notes (Signed)
ANTIBIOTIC CONSULT NOTE - FOLLOW UP  Pharmacy Consult for cefepime Indication: Bacteremia  Allergies  Allergen Reactions  . Vancomycin Itching    Patient usually takes Benadryl Before.     Patient Measurements: Height: 4\' 11"  (149.9 cm) Weight: 153 lb 7 oz (69.6 kg) IBW/kg (Calculated) : 43.2  Adjusted Body Weight:   Vital Signs: Temp: 99.3 F (37.4 C) (11/20 0459) BP: 107/66 mmHg (11/20 0459) Pulse Rate: 100  (11/20 0459) Intake/Output from previous day: 11/19 0701 - 11/20 0700 In: 150 [P.O.:100; IV Piggyback:50] Out: 3753 [Urine:750; Stool:3] Intake/Output from this shift: Total I/O In: 480 [P.O.:480] Out: -   Labs:  Basename 06/01/11 0700 05/31/11 0530 05/30/11 0525  WBC 1.7* 1.2* 1.4*  HGB 8.4* 8.6* 8.1*  PLT 136* 142* 133*  LABCREA -- -- --  CREATININE 6.06* 8.86* 7.40*   Estimated Creatinine Clearance: 12.3 ml/min (by C-G formula based on Cr of 6.06). No results found for this basename: VANCOTROUGH:2,VANCOPEAK:2,VANCORANDOM:2,GENTTROUGH:2,GENTPEAK:2,GENTRANDOM:2,TOBRATROUGH:2,TOBRAPEAK:2,TOBRARND:2,AMIKACINPEAK:2,AMIKACINTROU:2,AMIKACIN:2, in the last 72 hours   Microbiology: Recent Results (from the past 720 hour(s))  CULTURE, BLOOD (ROUTINE X 2)     Status: Normal   Collection Time   05/14/11  6:00 PM      Component Value Range Status Comment   Specimen Description BLOOD LEFT ARM   Final    Special Requests BOTTLES DRAWN AEROBIC AND ANAEROBIC 6CC   Final    Culture NO GROWTH 5 DAYS   Final    Report Status 05/19/2011 FINAL   Final   CULTURE, BLOOD (ROUTINE X 2)     Status: Normal   Collection Time   05/14/11  6:30 PM      Component Value Range Status Comment   Specimen Description BLOOD RIGHT ARM   Final    Special Requests BOTTLES DRAWN AEROBIC AND ANAEROBIC 6CC   Final    Culture NO GROWTH 5 DAYS   Final    Report Status 05/19/2011 FINAL   Final   URINE CULTURE     Status: Normal   Collection Time   05/14/11  6:43 PM      Component Value Range  Status Comment   Specimen Description URINE, CLEAN CATCH   Final    Special Requests NONE   Final    Setup Time [id number]   Final    Colony Count NO GROWTH   Final    Culture NO GROWTH   Final    Report Status 05/15/2011 FINAL   Final   URINE CULTURE     Status: Normal   Collection Time   05/15/11  1:04 AM      Component Value Range Status Comment   Specimen Description URINE, CLEAN CATCH   Final    Special Requests NONE   Final    Setup Time [id number]   Final    Colony Count NO GROWTH   Final    Culture NO GROWTH   Final    Report Status 05/16/2011 FINAL   Final   MRSA PCR SCREENING     Status: Normal   Collection Time   05/15/11  1:12 AM      Component Value Range Status Comment   MRSA by PCR NEGATIVE  NEGATIVE  Final   CLOSTRIDIUM DIFFICILE BY PCR     Status: Normal   Collection Time   05/15/11  1:15 AM      Component Value Range Status Comment   C difficile by pcr NEGATIVE  NEGATIVE  Final   CULTURE, BLOOD (ROUTINE X  2)     Status: Normal (Preliminary result)   Collection Time   05/28/11  1:58 PM      Component Value Range Status Comment   Specimen Description BLOOD LEFT FOREARM   Final    Special Requests BOTTLES DRAWN AEROBIC AND ANAEROBIC 4CC EACH   Final    Culture NO GROWTH 4 DAYS   Final    Report Status PENDING   Incomplete   CULTURE, BLOOD (ROUTINE X 2)     Status: Normal (Preliminary result)   Collection Time   05/28/11  2:14 PM      Component Value Range Status Comment   Specimen Description BLOOD RIGHT ARM   Final    Special Requests BOTTLES DRAWN AEROBIC AND ANAEROBIC 6CC   Final    Culture NO GROWTH 4 DAYS   Final    Report Status PENDING   Incomplete   MRSA PCR SCREENING     Status: Normal   Collection Time   05/28/11  8:04 PM      Component Value Range Status Comment   MRSA by PCR NEGATIVE  NEGATIVE  Final   CULTURE, BLOOD (SINGLE)     Status: Normal (Preliminary result)   Collection Time   05/29/11  4:25 PM      Component Value Range Status  Comment   Specimen Description BLOOD HEMODIALYSIS CATHETER   Final    Special Requests BOTTLES DRAWN AEROBIC AND ANAEROBIC 5CC   Final    Setup Time [id number]   Final    Culture     Final    Value:        BLOOD CULTURE RECEIVED NO GROWTH TO DATE CULTURE WILL BE HELD FOR 5 DAYS BEFORE ISSUING A FINAL NEGATIVE REPORT   Report Status PENDING   Incomplete   CLOSTRIDIUM DIFFICILE BY PCR     Status: Abnormal   Collection Time   05/30/11 10:32 AM      Component Value Range Status Comment   C difficile by pcr POSITIVE (*) NEGATIVE  Final   PATHOLOGIST SMEAR REVIEW     Status: Normal   Collection Time   06/01/11  7:00 AM      Component Value Range Status Comment   Tech Review Severe absolute neutropenia   Final     Anti-infectives     Start     Dose/Rate Route Frequency Ordered Stop   06/02/11 1200   ceFEPIme (MAXIPIME) 2 g in dextrose 5 % 50 mL IVPB        2 g 100 mL/hr over 30 Minutes Intravenous Every M-W-F (Hemodialysis) 06/01/11 1117     06/01/11 1200   ceFEPIme (MAXIPIME) 1 g in dextrose 5 % 50 mL IVPB        1 g 100 mL/hr over 30 Minutes Intravenous NOW 06/01/11 1117 06/02/11 1200   05/31/11 1600   metroNIDAZOLE (FLAGYL) IVPB 500 mg        500 mg 100 mL/hr over 60 Minutes Intravenous Every 8 hours 05/31/11 1505     05/31/11 1200   tobramycin (NEBCIN) 132 mg in dextrose 5 % 50 mL IVPB  Status:  Discontinued        132 mg 106.6 mL/hr over 30 Minutes Intravenous Every M-W-F (Hemodialysis) 05/29/11 0408 06/01/11 1115   05/30/11 1400   metroNIDAZOLE (FLAGYL) tablet 500 mg  Status:  Discontinued        500 mg Oral 3 times per day 05/30/11 1339 05/31/11 1505   05/29/11 0600  piperacillin-tazobactam (ZOSYN) IVPB 2.25 g  Status:  Discontinued        2.25 g 100 mL/hr over 30 Minutes Intravenous 3 times per day 05/29/11 0420 06/01/11 1115   05/29/11 0415   tobramycin (NEBCIN) 172 mg in dextrose 5 % 50 mL IVPB        172 mg 108.6 mL/hr over 30 Minutes Intravenous  Once 05/29/11  0408 05/29/11 0728   05/28/11 1400   vancomycin (VANCOCIN) IVPB 1000 mg/200 mL premix        1,000 mg 200 mL/hr over 60 Minutes Intravenous  Once 05/28/11 1356 05/28/11 1634   05/28/11 1400  piperacillin-tazobactam (ZOSYN) IVPB 3.375 g       3.375 g 12.5 mL/hr over 240 Minutes Intravenous  Once 05/28/11 1356 05/28/11 1833          Assessment: 25 yo newly HD pt who was admitted for GNR bacteremia. She was empirically placed on zosyn/tobra pending cultures. Call outpt HD center and culture showed enterobacter. D/w Dr. Arthor Captain, we'll de-escalate zosyn/tobra to cefepime to narrow therapy.   Plan:  1. Cefepime 1g IV x1 2. Cefepime 2g IV qHD   Ulyses Southward Ragland 06/01/2011,11:17 AM

## 2011-06-01 NOTE — Progress Notes (Signed)
ANTIBIOTIC CONSULT NOTE - FOLLOW UP  Pharmacy Consult for Zosyn/Tobramycin Indication: GNR bacteremia from OSH cultures  Allergies  Allergen Reactions  . Vancomycin Itching    Patient usually takes Benadryl Before.     Patient Measurements: Height: 4\' 11"  (149.9 cm) Weight: 153 lb 7 oz (69.6 kg) IBW/kg (Calculated) : 43.2  Adjusted Body Weight:   Vital Signs: Temp: 99.3 F (37.4 C) (11/20 0459) BP: 107/66 mmHg (11/20 0459) Pulse Rate: 100  (11/20 0459) Intake/Output from previous day: 11/19 0701 - 11/20 0700 In: 150 [P.O.:100; IV Piggyback:50] Out: 3753 [Urine:750; Stool:3] Intake/Output from this shift: Total I/O In: 480 [P.O.:480] Out: -  HD for 3.5 hours yesterday Labs:  [facility] 06/01/11 0700 05/31/11 0530 05/30/11 0525  WBC 1.7* 1.2* 1.4*  HGB 8.4* 8.6* 8.1*  PLT 136* 142* 133*  LABCREA -- -- --  CREATININE 6.06* 8.86* 7.40*   Estimated Creatinine Clearance: 12.3 ml/min (by C-G formula based on Cr of 6.06). No results found for this basename: VANCOTROUGH:2,VANCOPEAK:2,VANCORANDOM:2,GENTTROUGH:2,GENTPEAK:2,GENTRANDOM:2,TOBRATROUGH:2,TOBRAPEAK:2,TOBRARND:2,AMIKACINPEAK:2,AMIKACINTROU:2,AMIKACIN:2, in the last 72 hours   Microbiology: Recent Results (from the past 720 hour(s))  CULTURE, BLOOD (ROUTINE X 2)     Status: Normal   Collection Time   05/14/11  6:00 PM      Component Value Range Status Comment   Specimen Description BLOOD LEFT ARM   Final    Special Requests BOTTLES DRAWN AEROBIC AND ANAEROBIC 6CC   Final    Culture NO GROWTH 5 DAYS   Final    Report Status 05/19/2011 FINAL   Final   CULTURE, BLOOD (ROUTINE X 2)     Status: Normal   Collection Time   05/14/11  6:30 PM      Component Value Range Status Comment   Specimen Description BLOOD RIGHT ARM   Final    Special Requests BOTTLES DRAWN AEROBIC AND ANAEROBIC 6CC   Final    Culture NO GROWTH 5 DAYS   Final    Report Status 05/19/2011 FINAL   Final   URINE CULTURE     Status: Normal   Collection  Time   05/14/11  6:43 PM      Component Value Range Status Comment   Specimen Description URINE, CLEAN CATCH   Final    Special Requests NONE   Final    Setup Time [id number]   Final    Colony Count NO GROWTH   Final    Culture NO GROWTH   Final    Report Status 05/15/2011 FINAL   Final   URINE CULTURE     Status: Normal   Collection Time   05/15/11  1:04 AM      Component Value Range Status Comment   Specimen Description URINE, CLEAN CATCH   Final    Special Requests NONE   Final    Setup Time [id number]   Final    Colony Count NO GROWTH   Final    Culture NO GROWTH   Final    Report Status 05/16/2011 FINAL   Final   MRSA PCR SCREENING     Status: Normal   Collection Time   05/15/11  1:12 AM      Component Value Range Status Comment   MRSA by PCR NEGATIVE  NEGATIVE  Final   CLOSTRIDIUM DIFFICILE BY PCR     Status: Normal   Collection Time   05/15/11  1:15 AM      Component Value Range Status Comment   C difficile by pcr NEGATIVE  NEGATIVE  Final   CULTURE, BLOOD (ROUTINE X 2)     Status: Normal (Preliminary result)   Collection Time   05/28/11  1:58 PM      Component Value Range Status Comment   Specimen Description BLOOD LEFT FOREARM   Final    Special Requests BOTTLES DRAWN AEROBIC AND ANAEROBIC 4CC EACH   Final    Culture NO GROWTH 3 DAYS   Final    Report Status PENDING   Incomplete   CULTURE, BLOOD (ROUTINE X 2)     Status: Normal (Preliminary result)   Collection Time   05/28/11  2:14 PM      Component Value Range Status Comment   Specimen Description BLOOD RIGHT ARM   Final    Special Requests BOTTLES DRAWN AEROBIC AND ANAEROBIC 6CC   Final    Culture NO GROWTH 3 DAYS   Final    Report Status PENDING   Incomplete   MRSA PCR SCREENING     Status: Normal   Collection Time   05/28/11  8:04 PM      Component Value Range Status Comment   MRSA by PCR NEGATIVE  NEGATIVE  Final   CULTURE, BLOOD (SINGLE)     Status: Normal (Preliminary result)   Collection Time    05/29/11  4:25 PM      Component Value Range Status Comment   Specimen Description BLOOD HEMODIALYSIS CATHETER   Final    Special Requests BOTTLES DRAWN AEROBIC AND ANAEROBIC 5CC   Final    Setup Time [id number]   Final    Culture     Final    Value:        BLOOD CULTURE RECEIVED NO GROWTH TO DATE CULTURE WILL BE HELD FOR 5 DAYS BEFORE ISSUING A FINAL NEGATIVE REPORT   Report Status PENDING   Incomplete   CLOSTRIDIUM DIFFICILE BY PCR     Status: Abnormal   Collection Time   05/30/11 10:32 AM      Component Value Range Status Comment   C difficile by pcr POSITIVE (*) NEGATIVE  Final   PATHOLOGIST SMEAR REVIEW     Status: Normal   Collection Time   06/01/11  7:00 AM      Component Value Range Status Comment   Tech Review Severe absolute neutropenia   Final     Anti-infectives     Start     Dose/Rate Route Frequency Ordered Stop   05/31/11 1600   metroNIDAZOLE (FLAGYL) IVPB 500 mg        500 mg 100 mL/hr over 60 Minutes Intravenous Every 8 hours 05/31/11 1505     05/31/11 1200   tobramycin (NEBCIN) 132 mg in dextrose 5 % 50 mL IVPB        132 mg 106.6 mL/hr over 30 Minutes Intravenous Every M-W-F (Hemodialysis) 05/29/11 0408     05/30/11 1400   metroNIDAZOLE (FLAGYL) tablet 500 mg  Status:  Discontinued        500 mg Oral 3 times per day 05/30/11 1339 05/31/11 1505   05/29/11 0600  piperacillin-tazobactam (ZOSYN) IVPB 2.25 g       2.25 g 100 mL/hr over 30 Minutes Intravenous 3 times per day 05/29/11 0420     05/29/11 0415   tobramycin (NEBCIN) 172 mg in dextrose 5 % 50 mL IVPB        172 mg 108.6 mL/hr over 30 Minutes Intravenous  Once 05/29/11 0408 05/29/11 0728   05/28/11 1400   vancomycin (  VANCOCIN) IVPB 1000 mg/200 mL premix        1,000 mg 200 mL/hr over 60 Minutes Intravenous  Once 05/28/11 1356 05/28/11 1634   05/28/11 1400  piperacillin-tazobactam (ZOSYN) IVPB 3.375 g       3.375 g 12.5 mL/hr over 240 Minutes Intravenous  Once 05/28/11 1356 05/28/11 1833            Assessment: 25 yof with PMH of ESRD d/t PCKD found to have +GN bacteremia in culture for OSH started on broad therapy with zosyn and tobramycin. All cultures at [facility] have been negative to date, patient continues to have low grade fevers and is also positive for c.diff. Neutropenia continued but slightly improved. Antibiotics have been renally adjusted. Will likely need random tobra level prior to HD soon to determine if dosing appropriately.  Goal of Therapy:  Pre-hd tobra level 3-4  Plan:  Continue tobramycin 132mg  q hd -mwf Continue zosyn 2.25g q 8 hours Follow up final results of cxs drawn  Severiano Gilbert 06/01/2011,10:45 AM

## 2011-06-01 NOTE — Progress Notes (Signed)
Subjective:  Monica Guerra is a 25 y.o. 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    She still has some nausea. She vomited once yesterday and once this morning. She reported loose bowel movement almost every 2 hours. Had a spike of temperature 100.7 F.  Objective: Weight change: 0 kg (0 lb)  Intake/Output Summary (Last 24 hours) at 06/01/11 0835 Last data filed at 05/31/11 1600  Gross per 24 hour  Intake    110 ml  Output   3251 ml  Net  -3141 ml   Blood pressure 107/66, pulse 100, temperature 99.3 F (37.4 C), temperature source Oral, resp. rate 24, height 4\' 11"  (1.499 m), weight 69.6 kg (153 lb 7 oz), last menstrual period 04/23/2011, SpO2 93.00%. Temp:  [97.4 F (36.3 C)-100.7 F (38.2 C)] 99.3 F (37.4 C) (11/20 0459) Pulse Rate:  [72-140] 100  (11/20 0459) Resp:  [15-31] 24  (11/20 0459) BP: (107-143)/(60-82) 107/66 mmHg (11/20 0459) SpO2:  [93 %-99 %] 93 % (11/20 0459) Weight:  [68.9 kg (151 lb 14.4 oz)-70.1 kg (154 lb 8.7 oz)] 153 lb 7 oz (69.6 kg) (11/20 0459)  Physical Exam: General: No acute respiratory distress Lungs: Clear to auscultation bilaterally without wheezes or crackles Chest: subclavian area HD cath without erythema at insertion site Cardiovascular: Regular rate and rhythm without murmur gallop or rub normal S1 and S2 Abdomen: Mildly tender diffusely, nondistended, soft, bowel sounds positive, no rebound, no ascites, no appreciable mass Extremities: No significant cyanosis, clubbing, or edema bilateral lower extremities  Lab Results:  [facility] 05/31/11 0530 05/30/11 0525  NA 140 138  K 4.7 4.7  CL 103 104  CO2 19 22  GLUCOSE 94 81  BUN 41* 30*  CREATININE 8.86* 7.40*  CALCIUM 9.6 9.2  MG -- --  PHOS -- --   Basename 06/01/11 0700 05/31/11 0530 05/30/11 0525  WBC 1.7* 1.2* 1.4*  NEUTROABS 0.1* 0.1* --  HGB 8.4* 8.6* 8.1*  HCT 25.6* 26.7* 25.0*    MCV 89.8 92.1 93.3  PLT 136* 142* 133*   Micro Results: Recent Results (from the past 240 hour(s))  CULTURE, BLOOD (ROUTINE X 2)     Status: Normal (Preliminary result)   Collection Time   05/28/11  1:58 PM      Component Value Range Status Comment   Specimen Description BLOOD LEFT FOREARM   Final    Special Requests BOTTLES DRAWN AEROBIC AND ANAEROBIC 4CC EACH   Final    Culture NO GROWTH 3 DAYS   Final    Report Status PENDING   Incomplete   CULTURE, BLOOD (ROUTINE X 2)     Status: Normal (Preliminary result)   Collection Time   05/28/11  2:14 PM      Component Value Range Status Comment   Specimen Description BLOOD RIGHT ARM   Final    Special Requests BOTTLES DRAWN AEROBIC AND ANAEROBIC 6CC   Final    Culture NO GROWTH 3 DAYS   Final    Report Status PENDING   Incomplete   MRSA PCR SCREENING     Status: Normal   Collection Time   05/28/11  8:04 PM      Component Value Range Status Comment   MRSA by PCR NEGATIVE  NEGATIVE  Final   CULTURE, BLOOD (SINGLE)     Status: Normal (Preliminary result)   Collection Time  05/29/11  4:25 PM      Component Value Range Status Comment   Specimen Description BLOOD HEMODIALYSIS CATHETER   Final    Special Requests BOTTLES DRAWN AEROBIC AND ANAEROBIC 5CC   Final    Setup Time [id number]   Final    Culture     Final    Value:        BLOOD CULTURE RECEIVED NO GROWTH TO DATE CULTURE WILL BE HELD FOR 5 DAYS BEFORE ISSUING A FINAL NEGATIVE REPORT   Report Status PENDING   Incomplete   CLOSTRIDIUM DIFFICILE BY PCR     Status: Abnormal   Collection Time   05/30/11 10:32 AM      Component Value Range Status Comment   C difficile by pcr POSITIVE (*) NEGATIVE  Final     Studies/Results: Scheduled Meds:    . heparin  5,000 Units Subcutaneous Q8H  . lipase/protease/amylase  2 capsule Oral TID WC  . metronidazole  500 mg Intravenous Q8H  . pantoprazole  40 mg Oral Q1200  . piperacillin-tazobactam (ZOSYN)  IV  2.25 g Intravenous Q8H  .  tobramycin  132 mg Intravenous Q M,W,F-HD  . DISCONTD: metroNIDAZOLE  500 mg Oral Q8H   Continuous Infusions:  PRN Meds:.sodium chloride, sodium chloride, acetaminophen, albuterol, feeding supplement (NEPRO CARB STEADY), heparin, heparin, lidocaine, lidocaine-prilocaine, morphine injection, ondansetron (ZOFRAN) IV, ondansetron, pentafluoroprop-tetrafluoroeth, promethazine  Assessment/Plan:  Gram negative sepsis-from HD catheter no fever since 11/16-on emperic abx as initiated by critical care medicine-3 different blood cultures have been negative during this patient's hospital stay thus far-we will need to follow the blood cultures from her peripheral draws as well as from her dialysis catheter for 5 days to assure there is no evidence of a lingering infection-nephrology is also following-we will elicit there opinion as to whether or not the dialysis catheter will need to be changed-at this time I suspect that the patient will not require this change-nephrology appears to be attempting to obtain medical records from the patient's outpatient dialysis  Neutropenia The patient's white count continues to decline-this is felt originally to be related to the patient's gram-negative rod bacteremia-her platelet count has improved today-I hope this is an indication that her marrow is responding and that her neutropenia will soon begin to improve as well, she has been placed on neutropenic precautions. I will obtain hematology consult to help with comanagement  ESRD (end stage renal disease) on dialysis PCCM contacted Nephrology to resume typical HD schedule  (M-W-F)  Polycystic kidney disease  HTN (hypertension) Reasonably well controlled at present-no change in treatment plan today  Chronic pancreatitis On replacement enzymes  C. difficile colitis Flagyl therapy has been initiated-we will need to follow to assure that she is responding-if she is not show signs of improvement we may need to broaden  her C. difficile coverage-if she continues to vomit we may need to change to IV therapy  Disposition Still febrile, nauseous, having diarrhea. Will continue on the Flagyl. If she still having the diarrhea we might need to introduce oral vancomycin. Flagyl switched to IV form because of severe nausea associated with the oral administration.    LOS: 4 days   Monica Guerra A 06/01/2011, 8:35 AM

## 2011-06-02 ENCOUNTER — Inpatient Hospital Stay (HOSPITAL_COMMUNITY): Payer: PRIVATE HEALTH INSURANCE

## 2011-06-02 DIAGNOSIS — T827XXA Infection and inflammatory reaction due to other cardiac and vascular devices, implants and grafts, initial encounter: Secondary | ICD-10-CM

## 2011-06-02 DIAGNOSIS — A0472 Enterocolitis due to Clostridium difficile, not specified as recurrent: Secondary | ICD-10-CM

## 2011-06-02 DIAGNOSIS — R7881 Bacteremia: Secondary | ICD-10-CM

## 2011-06-02 LAB — CBC
Hemoglobin: 8 g/dL — ABNORMAL LOW (ref 12.0–15.0)
MCH: 29.6 pg (ref 26.0–34.0)
RBC: 2.7 MIL/uL — ABNORMAL LOW (ref 3.87–5.11)

## 2011-06-02 LAB — DIFFERENTIAL
Basophils Absolute: 0 10*3/uL (ref 0.0–0.1)
Basophils Relative: 0 % (ref 0–1)
Eosinophils Absolute: 0.2 10*3/uL (ref 0.0–0.7)
Eosinophils Relative: 9 % — ABNORMAL HIGH (ref 0–5)
Lymphocytes Relative: 68 % — ABNORMAL HIGH (ref 12–46)
Lymphs Abs: 1.2 10*3/uL (ref 0.7–4.0)
Monocytes Absolute: 0.4 10*3/uL (ref 0.1–1.0)
Monocytes Relative: 20 % — ABNORMAL HIGH (ref 3–12)
Neutro Abs: 0.1 10*3/uL — ABNORMAL LOW (ref 1.7–7.7)
Neutrophils Relative %: 3 % — ABNORMAL LOW (ref 43–77)

## 2011-06-02 LAB — PROTIME-INR
INR: 1.13 (ref 0.00–1.49)
Prothrombin Time: 14.7 seconds (ref 11.6–15.2)

## 2011-06-02 LAB — BASIC METABOLIC PANEL
Calcium: 9.5 mg/dL (ref 8.4–10.5)
Creatinine, Ser: 7.83 mg/dL — ABNORMAL HIGH (ref 0.50–1.10)
GFR calc Af Amer: 8 mL/min — ABNORMAL LOW (ref >90)

## 2011-06-02 LAB — CULTURE, BLOOD (ROUTINE X 2)

## 2011-06-02 MED ORDER — MORPHINE SULFATE 2 MG/ML IJ SOLN
INTRAMUSCULAR | Status: AC
  Administered 2011-06-04: 2 mg via INTRAVENOUS
  Filled 2011-06-02: qty 1

## 2011-06-02 MED ORDER — ONDANSETRON HCL 4 MG/2ML IJ SOLN
INTRAMUSCULAR | Status: AC
  Administered 2011-06-02: 4 mg via INTRAVENOUS
  Filled 2011-06-02: qty 2

## 2011-06-02 MED ORDER — DARBEPOETIN ALFA-POLYSORBATE 150 MCG/0.3ML IJ SOLN
INTRAMUSCULAR | Status: AC
  Administered 2011-06-02: 150 ug via INTRAVENOUS
  Filled 2011-06-02: qty 0.3

## 2011-06-02 MED ORDER — VANCOMYCIN HCL 1000 MG IV SOLR
1500.0000 mg | Freq: Once | INTRAVENOUS | Status: AC
  Administered 2011-06-02: 1500 mg via INTRAVENOUS
  Filled 2011-06-02: qty 1500

## 2011-06-02 MED ORDER — DIPHENHYDRAMINE HCL 25 MG PO CAPS
25.0000 mg | ORAL_CAPSULE | ORAL | Status: DC | PRN
  Administered 2011-06-05: 25 mg via ORAL

## 2011-06-02 MED ORDER — VANCOMYCIN 50 MG/ML ORAL SOLUTION
125.0000 mg | Freq: Four times a day (QID) | ORAL | Status: DC
  Administered 2011-06-02 – 2011-06-07 (×13): 125 mg via ORAL
  Filled 2011-06-02 (×22): qty 2.5

## 2011-06-02 MED ORDER — VANCOMYCIN HCL 1000 MG IV SOLR
750.0000 mg | INTRAVENOUS | Status: DC
  Administered 2011-06-05 – 2011-06-07 (×2): 750 mg via INTRAVENOUS
  Filled 2011-06-02 (×3): qty 750

## 2011-06-02 MED ORDER — ACETAMINOPHEN 325 MG PO TABS
ORAL_TABLET | ORAL | Status: AC
  Administered 2011-06-02: 650 mg via ORAL
  Filled 2011-06-02: qty 2

## 2011-06-02 MED ORDER — METRONIDAZOLE 500 MG PO TABS
500.0000 mg | ORAL_TABLET | Freq: Three times a day (TID) | ORAL | Status: DC
  Filled 2011-06-02 (×3): qty 1

## 2011-06-02 NOTE — Progress Notes (Addendum)
Subjective: Still having diarrhea along with some mild nausea. Called about spiking a fever of 103 during dialysis  Objective: Vital signs in last 24 hours: Filed Vitals:   06/02/11 0915 06/02/11 0930 06/02/11 0935 06/02/11 1000  BP: 117/76 69/46 128/70 130/78  Pulse: 118 120 112 114  Temp:      TempSrc:      Resp:      Height:      Weight:      SpO2:        Intake/Output Summary (Last 24 hours) at 06/02/11 1101 Last data filed at 06/02/11 1000  Gross per 24 hour  Intake   1190 ml  Output   1794 ml  Net   -604 ml    Weight change: -1.652 kg (-3 lb 10.3 oz)  General: Alert, awake, oriented x3, in no acute distress. HEENT: No bruits, no goiter. Heart: Regular rate and rhythm, without murmurs, rubs, gallops. Lungs: Clear to auscultation bilaterally. Abdomen: Soft, nontender, nondistended, positive bowel sounds. Extremities: No clubbing cyanosis or edema with positive pedal pulses. Neuro: Grossly intact, nonfocal.    Lab Results: Results for orders placed during the hospital encounter of 05/28/11 (from the past 24 hour(s))  CBC     Status: Abnormal   Collection Time   06/02/11  5:30 AM      Component Value Range   WBC 1.9 (*) 4.0 - 10.5 (K/uL)   RBC 2.70 (*) 3.87 - 5.11 (MIL/uL)   Hemoglobin 8.0 (*) 12.0 - 15.0 (g/dL)   HCT 82.9 (*) 56.2 - 46.0 (%)   MCV 90.0  78.0 - 100.0 (fL)   MCH 29.6  26.0 - 34.0 (pg)   MCHC 32.9  30.0 - 36.0 (g/dL)   RDW 13.0  86.5 - 78.4 (%)   Platelets 143 (*) 150 - 400 (K/uL)  DIFFERENTIAL     Status: Abnormal   Collection Time   06/02/11  5:30 AM      Component Value Range   Neutrophils Relative 3 (*) 43 - 77 (%)   Lymphocytes Relative 68 (*) 12 - 46 (%)   Monocytes Relative 20 (*) 3 - 12 (%)   Eosinophils Relative 9 (*) 0 - 5 (%)   Basophils Relative 0  0 - 1 (%)   Band Neutrophils 0  0 - 10 (%)   Metamyelocytes Relative 0     Myelocytes 0     Promyelocytes Absolute 0     Blasts 0     nRBC 0  0 (/100 WBC)   Neutro Abs 0.1 (*)  1.7 - 7.7 (K/uL)   Lymphs Abs 1.2  0.7 - 4.0 (K/uL)   Monocytes Absolute 0.4  0.1 - 1.0 (K/uL)   Eosinophils Absolute 0.2  0.0 - 0.7 (K/uL)   Basophils Absolute 0.0  0.0 - 0.1 (K/uL)  BASIC METABOLIC PANEL     Status: Abnormal   Collection Time   06/02/11  8:49 AM      Component Value Range   Sodium 142  135 - 145 (mEq/L)   Potassium 3.7  3.5 - 5.1 (mEq/L)   Chloride 105  96 - 112 (mEq/L)   CO2 22  19 - 32 (mEq/L)   Glucose, Bld 79  70 - 99 (mg/dL)   BUN 29 (*) 6 - 23 (mg/dL)   Creatinine, Ser 6.96 (*) 0.50 - 1.10 (mg/dL)   Calcium 9.5  8.4 - 29.5 (mg/dL)   GFR calc non Af Amer 7 (*) >90 (mL/min)  GFR calc Af Amer 8 (*) >90 (mL/min)     Micro: Recent Results (from the past 240 hour(s))  CULTURE, BLOOD (ROUTINE X 2)     Status: Normal (Preliminary result)   Collection Time   05/28/11  1:58 PM      Component Value Range Status Comment   Specimen Description BLOOD LEFT FOREARM   Final    Special Requests BOTTLES DRAWN AEROBIC AND ANAEROBIC 4CC EACH   Final    Culture NO GROWTH 4 DAYS   Final    Report Status PENDING   Incomplete   CULTURE, BLOOD (ROUTINE X 2)     Status: Normal (Preliminary result)   Collection Time   05/28/11  2:14 PM      Component Value Range Status Comment   Specimen Description BLOOD RIGHT ARM   Final    Special Requests BOTTLES DRAWN AEROBIC AND ANAEROBIC 6CC   Final    Culture NO GROWTH 4 DAYS   Final    Report Status PENDING   Incomplete   MRSA PCR SCREENING     Status: Normal   Collection Time   05/28/11  8:04 PM      Component Value Range Status Comment   MRSA by PCR NEGATIVE  NEGATIVE  Final   CULTURE, BLOOD (SINGLE)     Status: Normal (Preliminary result)   Collection Time   05/29/11  4:25 PM      Component Value Range Status Comment   Specimen Description BLOOD HEMODIALYSIS CATHETER   Final    Special Requests BOTTLES DRAWN AEROBIC AND ANAEROBIC 5CC   Final    Setup Time [id number]   Final    Culture     Final    Value:        BLOOD  CULTURE RECEIVED NO GROWTH TO DATE CULTURE WILL BE HELD FOR 5 DAYS BEFORE ISSUING A FINAL NEGATIVE REPORT   Report Status PENDING   Incomplete   CLOSTRIDIUM DIFFICILE BY PCR     Status: Abnormal   Collection Time   05/30/11 10:32 AM      Component Value Range Status Comment   C difficile by pcr POSITIVE (*) NEGATIVE  Final   PATHOLOGIST SMEAR REVIEW     Status: Normal   Collection Time   06/01/11  7:00 AM      Component Value Range Status Comment   Tech Review Severe absolute neutropenia   Final     Studies/Results: No results found.  Medications:     . ceFEPime (MAXIPIME) IV  1 g Intravenous NOW  . ceFEPime (MAXIPIME) IV  2 g Intravenous Q M,W,F-HD  . darbepoetin (ARANESP) injection - DIALYSIS  150 mcg Intravenous Q Wed-HD  . heparin  5,000 Units Subcutaneous Q8H  . lipase/protease/amylase  2 capsule Oral TID WC  . metronidazole  500 mg Intravenous Q8H  . pantoprazole  40 mg Oral Q1200  . DISCONTD: ciprofloxacin  400 mg Intravenous Q24H  . DISCONTD: piperacillin-tazobactam (ZOSYN)  IV  2.25 g Intravenous Q8H  . DISCONTD: tobramycin  132 mg Intravenous Q M,W,F-HD     Assessment: Principal Problem: Enterobacter Cloacea Sepsis    ESRD (end stage renal disease) on dialysis  Polycystic kidney disease, congenital  HTN (hypertension)  Chronic pancreatitis  Leukopenia   Plan:  1) ESRD- new to dialysis 5 weeks ago.Will continue to dialyze MWF in the hospital. Eventual Plan is for Peritoneal Dialysis.  2) Enterobacter cloacea Sepsis- On Maxipime.. ? Gentamicin discontinued, catheter relater versus other  source..since GNR will opt to not remove catheter on his occasion .Recurrent fever.Will add vancomycin . R/o pna, cxr ordered , repeat BC . REQUESTED ID  CONSULT . Dr Monica Guerra to make a decision about the catheter.  3.) C.Difficile Infection on by mouth Flagyl 4) Leukopenia -prob mutlifactorial, per hematology  5) HTN - amlodipine discontinued, as blood pressure low last night.   6) Anemia -begin ESA Rx (aranep--done) ,Monica Dell, MD, consulted, at this time there is no indication to start Neupogen, conservator be resolving spontaneously, check daily CBC with differential,    LOS: 5 days   [facility] 06/02/2011, 11:01 AM

## 2011-06-02 NOTE — H&P (Signed)
[facility] SHORT STAY H&P  Chief Complaint  Patient presents with  . Blood Infection  :   HPI:  This 25 year old lady, who has end-stage renal disease secondary to a form of kidney cystic disease, presents with 2 day history of fever. She had a fever when she had dialysis 2 days ago. At this time blood cultures were taken and she was given empirical antibiotics. Today she had dialysis also and again she had fever. However blood cultures taken 2 days ago are growing gram-negative rods, one out of four bottles. The patient describes a cough which is largely nonproductive, she has no abdominal pain or vomiting and she has no urinary symptoms. She does have a subclavian central catheter which is being used for hemodialysis. She did have a right subclavian catheter which failed. She is awaiting to have peritoneal dialysis catheter placed at [facility].  When she was evaluated in the emergency room she was found to be febrile with a resting tachycardia but acceptable blood pressure. Also, she has been found to have extreme leukopenia with a white blood cell count of 0.9 only.  Vascular Surgery is consulted for removal of her diatek catheter.  She is very tearful.  She states that the plan is to put in a PD catheter.  Past Medical History  Diagnosis Date  . Renal failure   . Polycystic kidney disease   . Chronic renal failure   . Hemodialysis status   . Anemia   . Congenital abnormalities     Orofacial digital syndrome    FH:  Non-Contributory  History   Social History  . Marital Status: Single    Spouse Name: N/A    Number of Children: N/A  . Years of Education: N/A   Occupational History  . Not on file.   Social History Main Topics  . Smoking status: Never Smoker   . Smokeless tobacco: Not on file  . Alcohol Use: No  . Drug Use: No  . Sexually Active:    Other Topics Concern  . Not on file   Social History Narrative  . No narrative on file    Allergies    Allergen Reactions  . Vancomycin Itching    Patient usually takes Benadryl Before.     Current Facility-Administered Medications  Medication Dose Route Frequency Provider Last Rate Last Dose  . 0.9 %  sodium chloride infusion  100 mL Intravenous PRN Maree Krabbe      . 0.9 %  sodium chloride infusion  100 mL Intravenous PRN Barbette Hair Schertz      . acetaminophen (TYLENOL) tablet 650 mg  650 mg Oral Q6H PRN Nimish C Gosrani   650 mg at 06/02/11 1134  . albuterol (PROVENTIL HFA;VENTOLIN HFA) 108 (90 BASE) MCG/ACT inhaler 4 puff  4 puff Inhalation Q2H PRN Shan Levans, MD      . ceFEPIme (MAXIPIME) 2 g in dextrose 5 % 50 mL IVPB  2 g Intravenous Q M,W,F-HD Minh Bronson, [place]   2 g at 06/02/11 1302  . darbepoetin (ARANESP) injection 150 mcg  150 mcg Intravenous Q Wed-HD Lauris Poag, MD   150 mcg at 06/02/11 4098  . diphenhydrAMINE (BENADRYL) capsule 25 mg  25 mg Oral Q4H PRN Nayana Abrol      . feeding supplement (NEPRO CARB STEADY) liquid 237 mL  237 mL Oral PRN Barbette Hair Schertz      . heparin injection 1,000 Units  1,000 Units Dialysis PRN  Barbette Hair Schertz      . heparin injection 1,400 Units  20 Units/kg Dialysis PRN Lauris Poag, MD      . heparin injection 5,000 Units  5,000 Units Subcutaneous Q8H Nimish C Gosrani   5,000 Units at 06/02/11 0546  . lidocaine (XYLOCAINE) 1 % injection 5 mL  5 mL Intradermal PRN Barbette Hair Schertz      . lidocaine-prilocaine (EMLA) cream 1 application  1 application Topical PRN Barbette Hair Schertz      . lipase/protease/amylase (CREON-10/PANCREASE) capsule 2 capsule  2 capsule Oral TID WC Nimish C Gosrani   2 capsule at 06/01/11 1127  . morphine 2 MG/ML injection 1-2 mg  1-2 mg Intravenous Q3H PRN Tinnie Gens T McClung   1 mg at 06/02/11 0945  . ondansetron (ZOFRAN) tablet 4 mg  4 mg Oral Q6H PRN Nimish C Gosrani   4 mg at 06/01/11 2330   Or  . ondansetron (ZOFRAN) injection 4 mg  4 mg Intravenous Q6H PRN Nimish C Gosrani   4 mg at 06/02/11 0942  .  pentafluoroprop-tetrafluoroeth (GEBAUERS) aerosol 1 application  1 application Topical PRN Barbette Hair Schertz      . promethazine (PHENERGAN) injection 12.5-25 mg  12.5-25 mg Intravenous Q4H PRN Elpidio Eric McClung   12.5 mg at 05/31/11 0915  . vancomycin (VANCOCIN) 1,500 mg in sodium chloride 0.9 % 500 mL IVPB  1,500 mg Intravenous Once Severiano Gilbert, PHARMD   1,500 mg at 06/02/11 1438  . vancomycin (VANCOCIN) 50 mg/mL oral solution 125 mg  125 mg Oral QID Judyann Munson, MD      . vancomycin (VANCOCIN) 750 mg in sodium chloride 0.9 % 150 mL IVPB  750 mg Intravenous Q M,W,F-HD Severiano Gilbert, PHARMD      . DISCONTD: metroNIDAZOLE (FLAGYL) IVPB 500 mg  500 mg Intravenous Q8H Jeffrey T McClung   500 mg at 06/01/11 2332  . DISCONTD: metroNIDAZOLE (FLAGYL) tablet 500 mg  500 mg Oral Q8H Nayana Abrol      . DISCONTD: pantoprazole (PROTONIX) EC tablet 40 mg  40 mg Oral Q1200 Nimish C Gosrani   40 mg at 06/02/11 1301    ROS:  + fevers spiking as high as 103 during HD. Denies PUD, melena, denies CP.    PHYSICAL EXAM  Filed Vitals:   06/02/11 1113  BP: 121/70  Pulse: 130  Temp: 103 F (39.4 C)  Resp:     Gen:  Tearful and anxious HEENT:  normocephalic Neck:  Left IJ catheter in place Heart:  RRR Lungs:  CTAB Abdomen:  Soft NT Extremities: 2+ radial pulses bilaterally Skin:  No obvious rashes Neuro:  In tact  Lab/X-ray:  INR is 1.13 today and platelets are 143  Impression: This is a 25 y.o. female who is on HD secondary to polycystic kidney disease.  She has been spiking fevers and her blood cultures are growing out GNR.  We will send the tip of the catheter for culture.  Plan:  Removal of left diatek catheter.  She may need another diatek catheter or temporary catheter before her peritoneal dialysis catheter is placed.  Newton Pigg, PA-C [facility] [phone] 06/02/2011  3:33 PM

## 2011-06-02 NOTE — Consult Note (Signed)
MD requesting consult: [facility]   Reason for consult: Neutropenia   ZOX:WRUE 25 year old [place], [place] woman with a history of ESRD secondary to polycystic kidney disease was transferred from [facility] on 05/28/2011 after being found to have GNR bacteremia likely from a left subclavian central catheter used for hemodialysis. She had fever (tmax 103 F), chills, myalgia and non-productive cough. She was started on empiric antibiotics and WBC was >10K.  Antibiotics included  Zosyn,Tobra (with HD),and Vanco.Subsequent labs  included a CBC with differential revealing significant leukopenia with a WBC of 0.9. Repeat WBC in the evening of 11/16 showed WBC to be 2.2 with ANC 1.5 dropping again on 11/17 to 1.4 (no diff obtained).  .  On 11/19WBC  were 1.2 with ANC 0.1.On Nov 20 her WBC showed improvement to 1.7 but ANC still 0.1 Today WBC has risen to 1.9, no diff (but blood film review shows few polys). We have been consulted regarding further evaluation and management options of the patient's leukopenia.   Of note , while H/H dropped from H/H was 8.0/24.6 to 7.9/24.7,counts began to recover thereafter. Her platelets were 116k at their lowest, eventually rising to 142k on 11/19.   The patient's status was further complicated by C difficile positive PCR. Her current antibiotics are maxepime and flagyl.    Past Medical History    Diagnosis  Date    .  Renal failure     .  Polycystic kidney disease     .  Chronic renal failure     .  Hemodialysis status     .  Anemia     .  Congenital abnormalities       Orofacial digital syndrome    Past Surgical History    Procedure  Date    .  Central venous catheter insertion       left chest    .  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 No pertinent family history for malignancy.   Gynecologic history:Menarche age 56. LMP 04/21/11. Duration 5 days. Never been pregnant. Not sexually active.   Social  History: reports that she has never smoked. She does not have any smokeless tobacco history on file. She reports that she does not drink alcohol or use illicit drugs.  Patient Is single, lives with parents. Does not work outside home. Originally from [place].   Health maintenance:  Cholesterol : never checked  Bone density :never checked  Colonoscopy: never had  (PAP) : never had   Allergies:     Allergies    Allergen  Reactions    .  Vancomycin  Itching      Patient usually takes Benadryl Before.    .  heparin  5,000 Units  Subcutaneous  Q8H    .  lipase/protease/amylase  2 capsule  Oral  TID WC    .  metronidazole  500 mg  Intravenous  Q8H    .  pantoprazole  40 mg  Oral  Q1200           .        Marland Kitchen  metroNIDAZOLE  500 mg  Oral  Q8H     ROS  Constitutional: Fever, chills on admission, now resolved. Positive malaise/fatigue 4 days prior to admission.  Eyes: Negative for blurred vision and double vision.  Respiratory: Non productive cough, no hemoptysis and shortness of breath. L Cath NT  Cardiovascular: negative for chest pain.  GI: No nausea, vomiting. Had diarrhea on admission which is improving. No change in bowel caliber. No Melena or Hematochezia. Had diffuse abdominal tenderness from recent acute on chronic pancreatitis followed by Dr. Matthias Hughs  GU: No blood in urine. No loss of urinary control.  Skin: Negative for itching. No rash. No petechia. No bruising  Neurological: No headaches. No motor or sensory deficits.  Musculoskeletal: chronic back pain.  Denies pain prior to fracture. Was fairly active and independent.  Rest of the ROS is negative   Physical Exam:  Blood pressure 107/66, pulse 100, temperature 99.3 F (37.4 C), temperature source Oral, resp. rate 24, height 4\' 11"  (1.499 m), weight 153 lb 7 oz (69.6 kg), last menstrual period 04/23/2011, SpO2 93.00%.  General: 25 y/o wf NAD AOx3  HEENT: Sclerae unicteric  Oropharynx clear. No mucositis or thrush  No  peripheral adenopathy  Lungs no rales or rhonchi or wheeezing  Heart regular rate and rhythm  Abdomen soft, slightly diffusely tender with deep palpation. No organomegaly or masses noted  MSK no focal spinal tenderness, no peripheral edema  Neuro: nonfocal  Breasts: not examined   LABS    Results for orders placed during the hospital encounter of 05/28/11 (from the past 48 hour(s))    CLOSTRIDIUM DIFFICILE BY PCR Status: Abnormal     Collection Time     05/30/11 10:32 AM    Component  Value  Range  Comment     C difficile by pcr  POSITIVE (*)  NEGATIVE     CBC Status: Abnormal     Collection Time     05/31/11 5:30 AM    Component  Value  Range  Comment     WBC  1.2 (*)  4.0 - 10.5 (K/uL)  CRITICAL VALUE NOTED. VALUE IS CONSISTENT WITH PREVIOUSLY REPORTED AND CALLED VALUE.     RBC  2.90 (*)  3.87 - 5.11 (MIL/uL)      Hemoglobin  8.6 (*)  12.0 - 15.0 (g/dL)      HCT  16.1 (*)  09.6 - 46.0 (%)      MCV  92.1  78.0 - 100.0 (fL)      MCH  29.7  26.0 - 34.0 (pg)      MCHC  32.2  30.0 - 36.0 (g/dL)      RDW  04.5  40.9 - 15.5 (%)      Platelets  142 (*)  150 - 400 (K/uL)     DIFFERENTIAL Status: Abnormal     Collection Time     05/31/11 5:30 AM    Component  Value  Range  Comment     Neutrophils Relative  7 (*)  43 - 77 (%)      Lymphocytes Relative  72 (*)  12 - 46 (%)      Monocytes Relative  17 (*)  3 - 12 (%)      Eosinophils Relative  3  0 - 5 (%)      Basophils Relative  1  0 - 1 (%)      Band Neutrophils  0  0 - 10 (%)      Metamyelocytes Relative  0       Myelocytes  0       Promyelocytes Absolute  0       Blasts  0       nRBC  0  0 (/100 WBC)      Neutro Abs  0.1 (*)  1.7 - 7.7 (K/uL)      Lymphs Abs  0.9  0.7 - 4.0 (K/uL)      Monocytes Absolute  0.2  0.1 - 1.0 (K/uL)      Eosinophils Absolute  0.0  0.0 - 0.7 (K/uL)      Basophils Absolute  0.0  0.0 - 0.1 (K/uL)      WBC Morphology  ATYPICAL LYMPHOCYTES   MILD LEFT SHIFT (1-5% METAS, OCC MYELO, OCC BANDS)    BASIC  METABOLIC PANEL Status: Abnormal     Collection Time     05/31/11 5:30 AM    Component  Value  Range  Comment     Sodium  140  135 - 145 (mEq/L)      Potassium  4.7  3.5 - 5.1 (mEq/L)  SLIGHT HEMOLYSIS     Chloride  103  96 - 112 (mEq/L)      CO2  19  19 - 32 (mEq/L)      Glucose, Bld  94  70 - 99 (mg/dL)      BUN  41 (*)  6 - 23 (mg/dL)      Creatinine, Ser  8.86 (*)  0.50 - 1.10 (mg/dL)      Calcium  9.6  8.4 - 10.5 (mg/dL)      GFR calc non Af Amer  6 (*)  >90 (mL/min)      GFR calc Af Amer  7 (*)  >90 (mL/min)     HEPATITIS B SURFACE ANTIGEN Status: Normal     Collection Time     05/31/11 11:11 AM    Component  Value  Range  Comment     Hepatitis B Surface Ag  NEGATIVE  NEGATIVE     CBC Status: Abnormal     Collection Time     06/01/11 7:00 AM    Component  Value  Range  Comment     WBC  1.7 (*)  4.0 - 10.5 (K/uL)      RBC  2.85 (*)  3.87 - 5.11 (MIL/uL)      Hemoglobin  8.4 (*)  12.0 - 15.0 (g/dL)      HCT  11.9 (*)  14.7 - 46.0 (%)      MCV  89.8  78.0 - 100.0 (fL)      MCH  29.5  26.0 - 34.0 (pg)      MCHC  32.8  30.0 - 36.0 (g/dL)      RDW  82.9  56.2 - 15.5 (%)      Platelets  136 (*)  150 - 400 (K/uL)     DIFFERENTIAL Status: Abnormal     Collection Time     06/01/11 7:00 AM    Component  Value  Range  Comment     Neutrophils Relative  3 (*)  43 - 77 (%)      Lymphocytes Relative  60 (*)  12 - 46 (%)      Monocytes Relative  28 (*)  3 - 12 (%)      Eosinophils Relative  7 (*)  0 - 5 (%)      Basophils Relative  2 (*)  0 - 1 (%)      Neutro Abs  0.1 (*)  1.7 - 7.7 (K/uL)      Lymphs Abs  1.0  0.7 - 4.0 (K/uL)      Monocytes Absolute  0.5  0.1 - 1.0 (K/uL)  Eosinophils Absolute  0.1  0.0 - 0.7 (K/uL)      Basophils Absolute  0.0  0.0 - 0.1 (K/uL)      WBC Morphology  MILD LEFT SHIFT (1-5% METAS, OCC MYELO, OCC BANDS)   ATYPICAL LYMPHOCYTES    BASIC METABOLIC PANEL Status: Abnormal     Collection Time     06/01/11 7:00 AM    Component  Value  Range   Comment     Sodium  138  135 - 145 (mEq/L)      Potassium  3.8  3.5 - 5.1 (mEq/L)      Chloride  100  96 - 112 (mEq/L)      CO2  22  19 - 32 (mEq/L)      Glucose, Bld  80  70 - 99 (mg/dL)      BUN  21  6 - 23 (mg/dL)      Creatinine, Ser  6.06 (*)  0.50 - 1.10 (mg/dL)      Calcium  9.1  8.4 - 10.5 (mg/dL)      GFR calc non Af Amer  9 (*)  >90 (mL/min)      GFR calc Af Amer  10 (*)  >90 (mL/min)     PATHOLOGIST SMEAR REVIEW Status: Normal     Collection Time     06/01/11 7:00 AM    Component  Value  Range  Comment     Tech Review  Severe absolute neutropenia          Assessment: :25 year old [place] woman with End Stage Renal Disease secondary to Polycystic Kidney Disease,  found to have GNR bacteremia likely from her left subclavian hemodyalysis catheter  as well as C difficile positivity by PCR, with acute leukopenia/neutropenia developing in the setting of infection and polypharmacy.   Review of the peripheral blood film shows adequate platelets, unremarkable red cells (specifically no schistocytes), and mostly unremarkable lymphs in the [facility] Cell series. There are no blasts, nucleated red blood cells, or other markers of primary marrow disease.  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  Plan: . Do not feel we need to start neupogen, as counts appear to be resolving spontaneously and patient is not unstable from an infectious point of view. Would follow CBC with differential daily [orders entered] until neutrophil count rises to >1000. I do not anticipate the need for further intervention and will follow peripherally with you. If we do not see resolution of the neutropenia over the next 7-10 days would reassess and consider bone marrow biopsy.  Thank you for the  referral.   [facility] [place] [facility]  06/01/2011, 9:39 AM    Revised: Ruthann Cancer MD 02 Jun 2011

## 2011-06-02 NOTE — Consult Note (Signed)
INFECTIOUS DISEASES CONSULTATION  Reason for Consult: on going fevers in setting of bacteremia and antibiotics Referring Physician:   [name] is an 25 y.o. female. from [place], [place] woman who is known to have congenital cranial facial abnormality and recently diagnosed with ESRD 2/2 polycystic kidney disease. She has recently started hemodialysis in the last 5 weeks, however, has had difficulty with maintaining an HD line due to occlusion, she has her 4th line in place currently. She was transferred from [facility] on 05/28/2011 after being found to have GNR bacteremia, later identified as enterobacter cloacae, likely from a left subclavian central catheter used for hemodialysis. She had fever (tmax 103 F), chills, myalgia and non-productive cough. She was started on empiric antibiotics and WBC was >10K. Antibiotics included Zosyn,Tobra (with HD),and Vanco. She mentioned that in addition to having fevers with HD for the past 5 days, she has also noticed having worsening diarrhea and N/V. Her work up revealed that she has c.difficile infection. Her admit labs were also remarkable for new onset leukopenia/neutropenia with a WBC of 0.9. Repeat WBC in the evening of 11/16 showed WBC to be 2.2 with ANC 1.5. On 11/21, she had her HD line removed, but she still reports having loose stools and persistent nausea.   Prior to this admission, she was recently hospitalized in early November for back pain, where they ruled out osteo/diskits, but found evidence of chronic pancreatitis. She was supposed to have a peritoneal dialysis place in the coming week prior to become ill 2/2 infection.  Abtx: cefepime, vancomycin, and metronidazole  Allergies  Allergen Reactions  . Vancomycin Itching    Patient usually takes Benadryl Before.       Past Medical History  Diagnosis Date  . Renal failure   . Polycystic kidney disease   . Chronic renal failure   . Hemodialysis status   . Anemia   .  Congenital abnormalities     Orofacial digital syndrome    Past Surgical History  Procedure Date  . Central venous catheter insertion     left chest  . Cleft palate repair     History reviewed. No pertinent family history. no family members with polycystic kidney disease  Social History:  reports that she has never smoked. She does not have any smokeless tobacco history on file. She reports that she does not drink alcohol or use illicit drugs.   Review of Systems  Constitutional: Positive for fever. Negative for chills and weight loss.  HENT: Negative for congestion, sore throat and neck pain.   Eyes: Negative for discharge.  Respiratory: Negative for cough, sputum production, shortness of breath, wheezing and stridor.   Cardiovascular: Negative for chest pain, palpitations, orthopnea and leg swelling.  Gastrointestinal: Positive for nausea, vomiting and diarrhea. Negative for heartburn, abdominal pain, constipation and blood in stool.  Genitourinary: Negative for dysuria and urgency.  Musculoskeletal: Negative for myalgias and back pain.  Skin: Negative for rash.  Neurological: Negative.  Negative for weakness and headaches.  Endo/Heme/Allergies: Negative.   Psychiatric/Behavioral: Negative.    Blood pressure 123/80, pulse 80, temperature 98.2 F (36.8 C), temperature source Oral, resp. rate 20, height 4\' 11"  (1.499 m), weight 67.8 kg (149 lb 7.6 oz), last menstrual period 04/23/2011, SpO2 97.00%. Physical Exam  Constitutional: She is oriented to person, place, and time. She appears well-nourished. No distress.  HENT:  Head: Atraumatic.  Nose: Nose normal.  Mouth/Throat: Oropharynx is clear and moist. No oropharyngeal exudate.  Eyes: Conjunctivae and EOM  are normal. Pupils are equal, round, and reactive to light. Right eye exhibits no discharge. Left eye exhibits no discharge. No scleral icterus.  Neck: Normal range of motion. Neck supple. No tracheal deviation present.    Cardiovascular: Normal rate, regular rhythm and normal heart sounds.  Exam reveals no gallop and no friction rub.   No murmur heard. Respiratory: Effort normal and breath sounds normal. She has no wheezes. She has no rales.  GI: Soft. Bowel sounds are normal. There is no tenderness. There is no rebound and no guarding.  Musculoskeletal: Normal range of motion. She exhibits no edema and no tenderness.  Lymphadenopathy:    She has no cervical adenopathy.  Neurological: She is alert and oriented to person, place, and time. She has normal reflexes. No cranial nerve deficit. She exhibits normal muscle tone. Coordination normal.  Skin: Skin is warm and dry. No rash noted. She is not diaphoretic. No pallor.  Psychiatric: She has a normal mood and affect.    Labs: CBC    Component Value Date/Time   WBC 1.9* 06/02/2011 0530   RBC 2.70* 06/02/2011 0530   HGB 8.0* 06/02/2011 0530   HCT 24.3* 06/02/2011 0530   PLT 143* 06/02/2011 0530   MCV 90.0 06/02/2011 0530   MCH 29.6 06/02/2011 0530   MCHC 32.9 06/02/2011 0530   RDW 15.3 06/02/2011 0530   LYMPHSABS 1.2 06/02/2011 0530   MONOABS 0.4 06/02/2011 0530   EOSABS 0.2 06/02/2011 0530   BASOSABS 0.0 06/02/2011 0530   anc 100  BMET    Component Value Date/Time   NA 142 06/02/2011 0849   K 3.7 06/02/2011 0849   CL 105 06/02/2011 0849   CO2 22 06/02/2011 0849   GLUCOSE 79 06/02/2011 0849   BUN 29* 06/02/2011 0849   CREATININE 7.83* 06/02/2011 0849   CALCIUM 9.5 06/02/2011 0849   GFRNONAA 7* 06/02/2011 0849   GFRAA 8* 06/02/2011 0849   Micro: Blood cx cdiff 11/18: POSITIVE  IMAGING: cxr 11/21 Early airspace disease at the left base  Assessment/Plan: 25 yo F with congenital PCKD ESRD newly started on HD < 5 wk, presents wtih Enterobacter bacteremia, neutropenia, and c.difficile infection  1) enterobacter cloacae bacteremia = will continue to treat with cefepime for 14 days, using day 1 as the first day of documented clear of  bacteremia. If patient is still having fevers despite having her HD line pulled today, we will need to consider looking for other sources of infection. Will need to track down susceptibilties of enterobacter  2) c.difficile colities = will switch patient to PO vancomycin 125mg  QID x 10 day course of therapy.  3) neutropenia= likely related to underlying infection. On her past hospitalization, her WBC is usually in the 7K at baseline. Would recommend to place her on neutropenic precautions until her ANC is > 500 for 48hrs.   4) fever = would repeat blood, urine, and sputum cultures if she has fever > 100.3. If cultures fail to reveal any MRSA, would consider d/c IV vancomycin.  Dr. Daiva Eves to see patient over the next 4 days to provide further recommendations.  Judyann Munson 06/02/2011, 11:15 PM

## 2011-06-02 NOTE — Progress Notes (Addendum)
[facility] Catheter Removal Procedure Note  Diagnosis: ESRD with polycystic kidney disease  Plan:  Remove left diatek catheter.  Collene Mares, PA-C spoke with Dr. Lowell Guitar who stated he definitely wants the diatek removed b/c it is most likely infected.  Consent signed:  yes Time out completed:  yes Coumadin:  no PT/INR (if applicable):  1.13 Other labs: n/a  Procedure: 1.  Sterile prepping and draping over catheter area 2. 4 ml 2% lidocaine plain instilled at removal site. 3.  left catheter removed in its entirety with cuff in tact. 4.  Complications:  none 5. Tip of catheter sent for culture:  yes   Patient tolerated procedure well:  yes Pressure held, no bleeding noted, dressing applied Instructions given to the pt regarding wound care and bleeding.   Newton Pigg 06/02/2011 3:54 PM

## 2011-06-02 NOTE — Progress Notes (Signed)
She spiked post dialysis which usually suggest catheter colonization/infection, therefore we will ask for catheter removal.  [facility] consulted.

## 2011-06-02 NOTE — Progress Notes (Signed)
Pt complaints of n/v throughout treatment. Medicated. Post hd treatment pt vomited 20 cc green emesis, while waiting for nausea to subside before transport pt stated she felt hot. Temp 103, p 132, bp 134/70, rr 20. Dr Susie Cassette notified. Ordered to draw blood cultures and give 650mg  of Tylenol. Dr. Susie Cassette to see pt in HD before transported back to 4700.

## 2011-06-02 NOTE — Progress Notes (Signed)
Subjective: Interval History: Still c/o diarrhea, nausea also & vomiting. Currently on dialysis.  Well tolerated. BFR 315 & stable hemodynamics  Objective: Vital signs in last 24 hours: Temp:  [98.6 F (37 C)-98.7 F (37.1 C)] 98.7 F (37.1 C) (11/21 0630) Pulse Rate:  [73-100] 100  (11/21 0700) Resp:  [14-79] 79  (11/21 0700) BP: (127-147)/(27-87) 128/83 mmHg (11/21 0700) SpO2:  [94 %-98 %] 98 % (11/21 0630) Weight:  [68.4 kg (150 lb 12.7 oz)-68.448 kg (150 lb 14.4 oz)] 150 lb 12.7 oz (68.4 kg) (11/21 0630) Weight change: -1.652 kg (-3 lb 10.3 oz)  Intake/Output from previous day: 11/20 0701 - 11/21 0700 In: 1670 [P.O.:1320; IV Piggyback:350] Out: 500 [Urine:500] Intake/Output this shift:    Lab Results:  Basename 06/02/11 0530 06/01/11 0700  WBC 1.9* 1.7*  HGB 8.0* 8.4*  HCT 24.3* 25.6*  PLT 143* 136*   BMET:  Basename 06/01/11 0700 05/31/11 0530  NA 138 140  K 3.8 4.7  CL 100 103  CO2 22 19  GLUCOSE 80 94  BUN 21 41*  CREATININE 6.06* 8.86*  CALCIUM 9.1 9.6   No results found for this basename: PTH:2 in the last 72 hours Iron Studies: No results found for this basename: IRON,TIBC,TRANSFERRIN,FERRITIN in the last 72 hours Studies/Results: No results found.  I have reviewed the patient's current medications.  Assessment/Plan: 1) ESRD- new to dialysis 5 weeks ago.Will continue to dialyze MWF in the hospital.  Eventual Plan is for Peritoneal Dialysis.  2) Enterobacter cloacea Sepsis- On Maxipime.Marland Kitchen ?catheter relater versus other source..since GNR will opt to not remove catheter on his occasion 3.) C.Difficile Infection  4) Leukopenia -prob mutlifactorial, per hematology  5) HTN - amlodipine.  6) Anemia -begin ESA Rx (aranep--done)      LOS: 5 days   Monica Guerra C 06/02/2011,7:19 AM

## 2011-06-02 NOTE — Progress Notes (Addendum)
Pharmacy - Vancomycin   25yof with enterobacter bacteremia now with temp of 103 in HD this am. New orders to add vancomycin to cefepime. Given catheter to be likely source of infection, plans are to have it removed.   Goal pre-hd vancomycin level - 15-25 Plan:  1.Vancomycin 1500mg  iv x 1 now 2.Vancomycin 750mg  IV with each HD  Noted allergy(itching) to vancomycin in past, pt usually tolerates it if given diphenhydramine prior to dosing. D/w with Dr. Susie Cassette the addition of pre-medication for patient.  06/02/2011 Severiano Gilbert

## 2011-06-02 NOTE — H&P (Signed)
Addendum  I have independently interviewed and examined the patient, and I agree with the physician assistant's findings.  The tunneled dialysis catheter will needed to be removed and cultured.  Leonides Sake, MD [facility] [place] Office: [phone] Pager: [phone]  06/02/2011, 3:55 PM

## 2011-06-03 ENCOUNTER — Inpatient Hospital Stay (HOSPITAL_COMMUNITY): Payer: PRIVATE HEALTH INSURANCE

## 2011-06-03 LAB — DIFFERENTIAL
Band Neutrophils: 0 % (ref 0–10)
Basophils Absolute: 0 10*3/uL (ref 0.0–0.1)
Basophils Relative: 0 % (ref 0–1)
Eosinophils Absolute: 0 10*3/uL (ref 0.0–0.7)
Eosinophils Relative: 0 % (ref 0–5)
Lymphocytes Relative: 58 % — ABNORMAL HIGH (ref 12–46)
Lymphs Abs: 1.3 10*3/uL (ref 0.7–4.0)
Monocytes Absolute: 0.7 10*3/uL (ref 0.1–1.0)
Promyelocytes Absolute: 0 %

## 2011-06-03 LAB — CBC
HCT: 23.3 % — ABNORMAL LOW (ref 36.0–46.0)
Hemoglobin: 7.7 g/dL — ABNORMAL LOW (ref 12.0–15.0)
MCHC: 33 g/dL (ref 30.0–36.0)
RBC: 2.62 MIL/uL — ABNORMAL LOW (ref 3.87–5.11)

## 2011-06-03 MED ORDER — METOCLOPRAMIDE HCL 5 MG/ML IJ SOLN
5.0000 mg | Freq: Three times a day (TID) | INTRAMUSCULAR | Status: DC
  Administered 2011-06-03 – 2011-06-07 (×11): 5 mg via INTRAVENOUS
  Filled 2011-06-03 (×15): qty 1

## 2011-06-03 NOTE — Progress Notes (Signed)
Subjective: Complains of nausea, unable to eat anything. Afebrile for the last 12 hours.  Objective: Vital signs in last 24 hours: Filed Vitals:   06/02/11 1000 06/02/11 1113 06/02/11 2052 06/03/11 0632  BP: 130/78 121/70 123/80 132/74  Pulse: 114 130 80 83  Temp: 98.9 F (37.2 C) 103 F (39.4 C) 98.2 F (36.8 C) 99.4 F (37.4 C)  TempSrc:      Resp:    18  Height:      Weight: 67.8 kg (149 lb 7.6 oz)   66.86 kg (147 lb 6.4 oz)  SpO2:   97% 96%    Intake/Output Summary (Last 24 hours) at 06/03/11 1114 Last data filed at 06/02/11 1800  Gross per 24 hour  Intake    170 ml  Output      0 ml  Net    170 ml    Weight change: -0.648 kg (-1 lb 6.9 oz) General: Alert, awake, oriented x3, in no acute distress. HEENT: No bruits, no goiter. Heart: Regular rate and rhythm, without murmurs, rubs, gallops. Lungs: Clear to auscultation bilaterally. Abdomen: Soft, nontender, nondistended, positive bowel sounds. Extremities: No clubbing cyanosis or edema with positive pedal pulses. Neuro: Grossly intact, nonfocal.   Lab Results: Results for orders placed during the hospital encounter of 05/28/11 (from the past 24 hour(s))  CULTURE, BLOOD (ROUTINE X 2)     Status: Normal (Preliminary result)   Collection Time   06/02/11 11:26 AM      Component Value Range   Specimen Description BLOOD CENTRAL LINE     Special Requests BOTTLES DRAWN AEROBIC AND ANAEROBIC 10CC     Setup Time [phone]     Culture       Value:        BLOOD CULTURE RECEIVED NO GROWTH TO DATE CULTURE WILL BE HELD FOR 5 DAYS BEFORE ISSUING A FINAL NEGATIVE REPORT   Report Status PENDING    CULTURE, BLOOD (ROUTINE X 2)     Status: Normal (Preliminary result)   Collection Time   06/02/11 11:28 AM      Component Value Range   Specimen Description BLOOD CENTRAL LINE     Special Requests BOTTLES DRAWN AEROBIC AND ANAEROBIC 10CC     Setup Time [id number]     Culture       Value:        BLOOD CULTURE RECEIVED NO  GROWTH TO DATE CULTURE WILL BE HELD FOR 5 DAYS BEFORE ISSUING A FINAL NEGATIVE REPORT   Report Status PENDING    PROTIME-INR     Status: Normal   Collection Time   06/02/11  2:44 PM      Component Value Range   Prothrombin Time 14.7  11.6 - 15.2 (seconds)   INR 1.13  0.00 - 1.49   CATH TIP CULTURE     Status: Normal (Preliminary result)   Collection Time   06/02/11  4:30 PM      Component Value Range   Specimen Description CATH TIP     Special Requests NONE     Culture NO GROWTH     Report Status PENDING    CBC     Status: Abnormal   Collection Time   06/03/11  6:30 AM      Component Value Range   WBC 2.1 (*) 4.0 - 10.5 (K/uL)   RBC 2.62 (*) 3.87 - 5.11 (MIL/uL)   Hemoglobin 7.7 (*) 12.0 - 15.0 (g/dL)   HCT 21.3 (*) 08.6 - 46.0 (%)  MCV 88.9  78.0 - 100.0 (fL)   MCH 29.4  26.0 - 34.0 (pg)   MCHC 33.0  30.0 - 36.0 (g/dL)   RDW 16.1  09.6 - 04.5 (%)   Platelets 149 (*) 150 - 400 (K/uL)  DIFFERENTIAL     Status: Abnormal   Collection Time   06/03/11  6:30 AM      Component Value Range   Neutrophils Relative 7 (*) 43 - 77 (%)   Lymphocytes Relative 58 (*) 12 - 46 (%)   Monocytes Relative 35 (*) 3 - 12 (%)   Eosinophils Relative 0  0 - 5 (%)   Basophils Relative 0  0 - 1 (%)   Band Neutrophils 0  0 - 10 (%)   Metamyelocytes Relative 0     Myelocytes 0     Promyelocytes Absolute 0     Blasts 0     nRBC 0  0 (/100 WBC)   Neutro Abs 0.1 (*) 1.7 - 7.7 (K/uL)   Lymphs Abs 1.3  0.7 - 4.0 (K/uL)   Monocytes Absolute 0.7  0.1 - 1.0 (K/uL)   Eosinophils Absolute 0.0  0.0 - 0.7 (K/uL)   Basophils Absolute 0.0  0.0 - 0.1 (K/uL)   RBC Morphology SPHEROCYTES     WBC Morphology ATYPICAL LYMPHOCYTES       Micro: Recent Results (from the past 240 hour(s))  CULTURE, BLOOD (ROUTINE X 2)     Status: Normal   Collection Time   05/28/11  1:58 PM      Component Value Range Status Comment   Specimen Description BLOOD LEFT FOREARM   Final    Special Requests BOTTLES DRAWN AEROBIC AND  ANAEROBIC 4CC EACH   Final    Culture NO GROWTH 5 DAYS   Final    Report Status 06/02/2011 FINAL   Final   CULTURE, BLOOD (ROUTINE X 2)     Status: Normal   Collection Time   05/28/11  2:14 PM      Component Value Range Status Comment   Specimen Description BLOOD RIGHT ARM   Final    Special Requests BOTTLES DRAWN AEROBIC AND ANAEROBIC 6CC   Final    Culture NO GROWTH 5 DAYS   Final    Report Status 06/02/2011 FINAL   Final   MRSA PCR SCREENING     Status: Normal   Collection Time   05/28/11  8:04 PM      Component Value Range Status Comment   MRSA by PCR NEGATIVE  NEGATIVE  Final   CULTURE, BLOOD (SINGLE)     Status: Normal (Preliminary result)   Collection Time   05/29/11  4:25 PM      Component Value Range Status Comment   Specimen Description BLOOD HEMODIALYSIS CATHETER   Final    Special Requests BOTTLES DRAWN AEROBIC AND ANAEROBIC 5CC   Final    Setup Time [id number]   Final    Culture     Final    Value:        BLOOD CULTURE RECEIVED NO GROWTH TO DATE CULTURE WILL BE HELD FOR 5 DAYS BEFORE ISSUING A FINAL NEGATIVE REPORT   Report Status PENDING   Incomplete   CLOSTRIDIUM DIFFICILE BY PCR     Status: Abnormal   Collection Time   05/30/11 10:32 AM      Component Value Range Status Comment   C difficile by pcr POSITIVE (*) NEGATIVE  Final   PATHOLOGIST SMEAR REVIEW  Status: Normal   Collection Time   06/01/11  7:00 AM      Component Value Range Status Comment   Tech Review Severe absolute neutropenia   Final   CULTURE, BLOOD (ROUTINE X 2)     Status: Normal (Preliminary result)   Collection Time   06/02/11 11:26 AM      Component Value Range Status Comment   Specimen Description BLOOD CENTRAL LINE   Final    Special Requests BOTTLES DRAWN AEROBIC AND ANAEROBIC 10CC   Final    Setup Time [id number]   Final    Culture     Final    Value:        BLOOD CULTURE RECEIVED NO GROWTH TO DATE CULTURE WILL BE HELD FOR 5 DAYS BEFORE ISSUING A FINAL NEGATIVE REPORT   Report  Status PENDING   Incomplete   CULTURE, BLOOD (ROUTINE X 2)     Status: Normal (Preliminary result)   Collection Time   06/02/11 11:28 AM      Component Value Range Status Comment   Specimen Description BLOOD CENTRAL LINE   Final    Special Requests BOTTLES DRAWN AEROBIC AND ANAEROBIC 10CC   Final    Setup Time [id number]   Final    Culture     Final    Value:        BLOOD CULTURE RECEIVED NO GROWTH TO DATE CULTURE WILL BE HELD FOR 5 DAYS BEFORE ISSUING A FINAL NEGATIVE REPORT   Report Status PENDING   Incomplete   CATH TIP CULTURE     Status: Normal (Preliminary result)   Collection Time   06/02/11  4:30 PM      Component Value Range Status Comment   Specimen Description CATH TIP   Final    Special Requests NONE   Final    Culture NO GROWTH   Final    Report Status PENDING   Incomplete     Studies/Results: Dg Chest Port 1 View  06/02/2011  *RADIOLOGY REPORT*   IMPRESSION: New early airspace disease at the left base.  Original Report Authenticated By: Donavan Burnet, M.D.    Medications:     . ceFEPime (MAXIPIME) IV  2 g Intravenous Q M,W,F-HD  . darbepoetin (ARANESP) injection - DIALYSIS  150 mcg Intravenous Q Wed-HD  . heparin  5,000 Units Subcutaneous Q8H  . lipase/protease/amylase  2 capsule Oral TID WC  . metoCLOPramide (REGLAN) injection  5 mg Intravenous Q8H  . vancomycin  1,500 mg Intravenous Once  . vancomycin  125 mg Oral QID  . vancomycin  750 mg Intravenous Q M,W,F-HD  . DISCONTD: metroNIDAZOLE  500 mg Oral Q8H  . DISCONTD: pantoprazole  40 mg Oral Q1200    Assessment:    1) ESRD- new to dialysis 5 weeks ago.Will continue to dialyze MWF in the hospital. Eventual Plan is for Peritoneal Dialysis. He will off dialysis catheter yesterday. Patient would need a tunneled dialysis catheter placed before Monday.   2) fever/Enterobacter cloacea Sepsis- On Maxipime.. ? Gentamicin discontinued, catheter relater versus other source..since GNR   .Recurrent fever.Will add  vancomycin . X-ray suggestive of left lower lobe infiltrate. Dr. Jerolyn Center has seen the patient, and she recommends to treat with cefepime for 14 days, and to insure that her bacteremia has cleared up.  3.) C.Difficile ID recommends vancomycin 125 mg 4 times a day for 10 days  4) Leukopenia neutropenia, ANC still less than 500. -prob mutlifactorial, per hematology  5)  HTN - amlodipine discontinued, as blood pressure low last night.  6) Anemia -begin ESA Rx (aranep--done) ,[name], MD, consulted, at this time there is no indication to start Neupogen, conservator be resolving spontaneously, check daily CBC with differential,    #7 nausea likely secondary to medications for such as by mouth vancomycin, will start her on Reglan scheduled to see this helps.     LOS: 6 days   [facility] 06/03/2011, 11:14 AM

## 2011-06-03 NOTE — Progress Notes (Signed)
[facility]  Daily Progress Note   No complications from [facility] removal overnight.  Dr. Edilia Bo will be covering for the group today.  Please let us know if and when the patient will need another tunneled dialysis catheter placed.  I suspect a temporary dialysis catheter may be in her best interest in the immediate time period.  CBC    Component Value Date/Time   WBC 1.9* 06/02/2011 0530   RBC 2.70* 06/02/2011 0530   HGB 8.0* 06/02/2011 0530   HCT 24.3* 06/02/2011 0530   PLT 143* 06/02/2011 0530   MCV 90.0 06/02/2011 0530   MCH 29.6 06/02/2011 0530   MCHC 32.9 06/02/2011 0530   RDW 15.3 06/02/2011 0530   LYMPHSABS 1.2 06/02/2011 0530   MONOABS 0.4 06/02/2011 0530   EOSABS 0.2 06/02/2011 0530   BASOSABS 0.0 06/02/2011 0530   BMET    Component Value Date/Time   NA 142 06/02/2011 0849   K 3.7 06/02/2011 0849   CL 105 06/02/2011 0849   CO2 22 06/02/2011 0849   GLUCOSE 79 06/02/2011 0849   BUN 29* 06/02/2011 0849   CREATININE 7.83* 06/02/2011 0849   CALCIUM 9.5 06/02/2011 0849   GFRNONAA 7* 06/02/2011 0849   GFRAA 8* 06/02/2011 0849   Blood Culture    Component Value Date/Time   SDES BLOOD HEMODIALYSIS CATHETER 05/29/2011 1625   SPECREQUEST BOTTLES DRAWN AEROBIC AND ANAEROBIC 5CC 05/29/2011 1625   CULT        BLOOD CULTURE RECEIVED NO GROWTH TO DATE CULTURE WILL BE HELD FOR 5 DAYS BEFORE ISSUING A FINAL NEGATIVE REPORT 05/29/2011 1625   REPTSTATUS PENDING 05/29/2011 1625   Leonides Sake, MD [facility] [place] Office: [phone] Pager: [phone]  06/03/2011, 6:24 AM

## 2011-06-03 NOTE — Progress Notes (Signed)
Subjective: Unhappy being in hospital on Thanksgiving  Objective: Weight change: -1 lb 6.9 oz (-0.648 kg)  Intake/Output Summary (Last 24 hours) at 06/03/11 1407 Last data filed at 06/03/11 1315  Gross per 24 hour  Intake    120 ml  Output      0 ml  Net    120 ml   Blood pressure 134/86, pulse 96, temperature 99 F (37.2 C), temperature source Oral, resp. rate 20, height 4\' 11"  (1.499 m), weight 147 lb 6.4 oz (66.86 kg), last menstrual period 04/23/2011, SpO2 97.00%. Temp:  [98.2 F (36.8 C)-99.4 F (37.4 C)] 99 F (37.2 C) (11/22 1402) Pulse Rate:  [80-96] 96  (11/22 1402) Resp:  [18-20] 20  (11/22 1402) BP: (123-134)/(74-86) 134/86 mmHg (11/22 1402) SpO2:  [96 %-97 %] 97 % (11/22 1402) Weight:  [147 lb 6.4 oz (66.86 kg)] 147 lb 6.4 oz (66.86 kg) (11/22 9528)  Physical Exam: General: Alert and awake, oriented x3, not in any acute distress. HEENT: anicteric sclera, pupils reactive to light and accommodation, EOMI CVS regular rate, normal r,  no murmur rubs or gallops Chest: clear to auscultation bilaterally, no wheezing, rales or rhonchi Abdomen: slightly distended, pos bowel sounds Skin: no rashes Neuro: nonfocal  Lab Results:  Basename 06/03/11 0630 06/02/11 0530  WBC 2.1* 1.9*  HGB 7.7* 8.0*  HCT 23.3* 24.3*  PLT 149* 143*   BMET  Basename 06/02/11 0849 06/01/11 0700  NA 142 138  K 3.7 3.8  CL 105 100  CO2 22 22  GLUCOSE 79 80  BUN 29* 21  CREATININE 7.83* 6.06*  CALCIUM 9.5 9.1    Micro Results: Recent Results (from the past 240 hour(s))  CULTURE, BLOOD (ROUTINE X 2)     Status: Normal   Collection Time   05/28/11  1:58 PM      Component Value Range Status Comment   Specimen Description BLOOD LEFT FOREARM   Final    Special Requests BOTTLES DRAWN AEROBIC AND ANAEROBIC 4CC EACH   Final    Culture NO GROWTH 5 DAYS   Final    Report Status 06/02/2011 FINAL   Final   CULTURE, BLOOD (ROUTINE X 2)     Status: Normal   Collection Time   05/28/11  2:14  PM      Component Value Range Status Comment   Specimen Description BLOOD RIGHT ARM   Final    Special Requests BOTTLES DRAWN AEROBIC AND ANAEROBIC 6CC   Final    Culture NO GROWTH 5 DAYS   Final    Report Status 06/02/2011 FINAL   Final   MRSA PCR SCREENING     Status: Normal   Collection Time   05/28/11  8:04 PM      Component Value Range Status Comment   MRSA by PCR NEGATIVE  NEGATIVE  Final   CULTURE, BLOOD (SINGLE)     Status: Normal (Preliminary result)   Collection Time   05/29/11  4:25 PM      Component Value Range Status Comment   Specimen Description BLOOD HEMODIALYSIS CATHETER   Final    Special Requests BOTTLES DRAWN AEROBIC AND ANAEROBIC 5CC   Final    Setup Time [id number]   Final    Culture     Final    Value:        BLOOD CULTURE RECEIVED NO GROWTH TO DATE CULTURE WILL BE HELD FOR 5 DAYS BEFORE ISSUING A FINAL NEGATIVE REPORT   Report Status PENDING  Incomplete   CLOSTRIDIUM DIFFICILE BY PCR     Status: Abnormal   Collection Time   05/30/11 10:32 AM      Component Value Range Status Comment   C difficile by pcr POSITIVE (*) NEGATIVE  Final   PATHOLOGIST SMEAR REVIEW     Status: Normal   Collection Time   06/01/11  7:00 AM      Component Value Range Status Comment   Tech Review Severe absolute neutropenia   Final   CULTURE, BLOOD (ROUTINE X 2)     Status: Normal (Preliminary result)   Collection Time   06/02/11 11:26 AM      Component Value Range Status Comment   Specimen Description BLOOD CENTRAL LINE   Final    Special Requests BOTTLES DRAWN AEROBIC AND ANAEROBIC 10CC   Final    Setup Time [id number]   Final    Culture     Final    Value:        BLOOD CULTURE RECEIVED NO GROWTH TO DATE CULTURE WILL BE HELD FOR 5 DAYS BEFORE ISSUING A FINAL NEGATIVE REPORT   Report Status PENDING   Incomplete   CULTURE, BLOOD (ROUTINE X 2)     Status: Normal (Preliminary result)   Collection Time   06/02/11 11:28 AM      Component Value Range Status Comment   Specimen  Description BLOOD CENTRAL LINE   Final    Special Requests BOTTLES DRAWN AEROBIC AND ANAEROBIC 10CC   Final    Setup Time [id number]   Final    Culture     Final    Value:        BLOOD CULTURE RECEIVED NO GROWTH TO DATE CULTURE WILL BE HELD FOR 5 DAYS BEFORE ISSUING A FINAL NEGATIVE REPORT   Report Status PENDING   Incomplete   CATH TIP CULTURE     Status: Normal (Preliminary result)   Collection Time   06/02/11  4:30 PM      Component Value Range Status Comment   Specimen Description CATH TIP   Final    Special Requests NONE   Final    Culture NO GROWTH   Final    Report Status PENDING   Incomplete     Studies/Results: Ct Abdomen Pelvis Wo Contrast  05/15/2011  **ADDENDUM** CREATED: 05/15/2011 17:18:13  After reviewing the nonenhanced CT along with today's ultrasound, there is likely asymmetric left intrahepatic biliary ductal dilatation.  I would recommend further evaluation with MRCP to assess for obstructing stone or lesion.  **END ADDENDUM** SIGNED BY: Aubery Lapping. Dover, M.D.   05/15/2011  *RADIOLOGY REPORT*  Clinical Data: Back pain, fever.  CT ABDOMEN AND PELVIS WITHOUT CONTRAST  Technique:  Multidetector CT imaging of the abdomen and pelvis was performed following the standard protocol without intravenous contrast.  Comparison: 04/21/2011  Findings: Dependent atelectasis in the lung bases.  Trace pleural effusions.  Heart is upper limits normal in size.  Periportal areas of low density again noted as seen on prior study, possibly periportal edema.  This could also represent intrahepatic biliary ductal dilatation, but it is difficult to determine without IV contrast.  Spleen is upper limits normal in size.  No definite focal lesion.  Pancreatic calcifications are noted compatible with chronic pancreatitis.  There is a small amount of stranding adjacent to the pancreatic tail and extending into the left anterior pararenal space.  There is also stranding in the right anterior pararenal space.   Findings may reflect early  or mild changes of acute pancreatitis.  Recommend clinical correlation. Small amount of free fluid is seen in the pelvis.  Uterus and adnexa are unremarkable.  Large and small bowel grossly unremarkable.  No acute bony abnormality.  IMPRESSION: Changes of chronic pancreatitis with calcifications present.  There is stranding near the pancreas and extending in the anterior pararenal spaces bilaterally suggesting the possibility of acute pancreatitis.  Question periportal edema versus intrahepatic biliary ductal dilatation.  It is difficult to differentiate without IV contrast.  Bibasilar atelectasis, trace effusions.  Original Report Authenticated By: Cyndie Chime, M.D.   Dg Chest 2 View  05/14/2011  *RADIOLOGY REPORT*  Clinical Data: Fever.  CHEST - 2 VIEW  Comparison: None.  Findings: Left dialysis catheter is in place.  The distal tip was directed laterally into the lateral wall of the upper SVC.  Heart is normal size.  Lungs are clear.  No confluent opacity or effusion.  No acute bony abnormality.  IMPRESSION: No active cardiopulmonary disease.  Original Report Authenticated By: Cyndie Chime, M.D.   Mr Lumbar Spine Wo Contrast  05/15/2011  *RADIOLOGY REPORT*  Clinical Data: Back pain and fever.  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 Evaluate for diskitis or abscess.  MRI LUMBAR SPINE WITHOUT CONTRAST  Technique:  Multiplanar and multiecho pulse sequences of the lumbar spine were obtained without intravenous contrast.  Comparison: Abdominal pelvic CT 05/14/2011.  Findings: CT demonstrates five lumbar type vertebral bodies. Alignment is normal.  There is no evidence of diskitis or osteomyelitis.  There is no evidence of fracture or pars defect.  The conus medullaris extends to the L1-L2 level and appears normal. No focal paraspinal abnormalities are identified.  As demonstrated on recent CT, there is mild retroperitoneal edema.  In addition, there is mild edema within the erector  spinae musculature bilaterally.  Both kidneys are grossly abnormal with numerous cysts.  The kidneys are not significantly enlarged.  There is no hydronephrosis.  All of the lumbar discs are well hydrated.  There is a shallow right paracentral disc protrusion at T11-T12.  There are no significant disc space findings from T12-L1 through L3-L4.  L4-L5:  Minimal disc bulge.  There is minimal fluid in the facet joints bilaterally.  There is no subchondral sclerosis or destruction.  There is no foraminal compromise or nerve root encroachment.  L5-S1:  Minimal facet hypertrophy.  No spinal stenosis or nerve root encroachment.  IMPRESSION:  1.  No evidence of lumbar diskitis or osteomyelitis. 2.  No focal paraspinal fluid collections are identified.  There is retroperitoneal edema as well as mild edema in the posterior paraspinal musculature. 3.  Mild facet hypertrophy at L4-L5 and L5-S1. 4.  Multicystic kidneys.  Original Report Authenticated By: Gerrianne Scale, M.D.   US Abdomen Complete  05/15/2011  *RADIOLOGY REPORT*  Clinical Data: Pancreatitis.  Evaluate for gallstones. History of congenital polycystic kidney disease with a stage V chronic kidney disease.  ABDOMEN ULTRASOUND  Technique:  Complete abdominal ultrasound examination was performed including evaluation of the liver, gallbladder, bile ducts, pancreas, kidneys, spleen, IVC, and abdominal aorta.  Comparison: CT scan from 05/14/2011.  Findings:  Gallbladder:  Gallbladder wall is not well distended.  No gallstones are evident, but gallbladder wall thickness is at upper normal measuring 3 mm.  There is no pericholecystic fluid.  The sonographer reports no sonographic Murphy's sign.  Common Bile Duct:  Nondilated at 3 mm diameter.  Liver:  The no focal intraparenchymal abnormality.  Main portal vein  is prominent.  No substantial intrahepatic biliary duct dilatation.  IVC:  Normal.  Pancreas:  Pancreatic head and tail are poorly visualized secondary to  overlying, obscuring bowel gas.  Spleen:  Unremarkable  Right kidney:  9.6 cm in long axis.  Diffusely abnormal appearance with cortical thinning and multiple cystic areas, some of which are relatively well defined and others which are not.  Largest identified cyst measures about 2.4 cm.  Left kidney:  13.7 cm in long axis.  Similar appearance to the contralateral kidney with numerous cystic areas of varying sizes, some of the cystic areas are poorly defined.  Abdominal Aorta:  No aneurysm.  IMPRESSION: Gallbladder is not well distended, but no gallstones are evident. Gallbladder wall thickness is borderline increased without pericholecystic fluid or sonographic Murphy's sign.  Markedly abnormal appearance to the kidneys with cortical thinning and diffuse cystic change bilaterally.  The the patient has a history of congenital renal cystic disease and chronic renal insufficiency.  Original Report Authenticated By: ERIC A. MANSELL, M.D.   Mr Mrcp  05/19/2011  *RADIOLOGY REPORT*  Clinical Data: MRCP, chronic pancreatitis.  MRI ABDOMEN WITHOUT CONTRAST (MRCP)  Technique:  Multiplanar multisequence MR imaging of the abdomen was performed, including heavily T2-weighted images of the biliary and pancreatic ducts.  Three-dimensional MR images were rendered by post processing of the original MR data.  Comparison:  CT 05/14/2011, ultrasound 05/15/2011  Findings:  No pericardial fluid or pleural fluid.  There is near complete replacement of the renal parenchyma by multiple renal cysts consistent with polycystic renal disease.  There is biliary ductal ectasia and small cystic lesions along the left and right intrahepatic biliary ducts.  There are several focal cystic ductal lesions measuring up to 12 mm within the left ductal system.  The ductal ectasia/cystic change is more prominent within the lateral hepatic lobe (image 26, series 2).  There is no obstructing lesion evident at the confluence of the hepatic ducts. The  common bile duct and common hepatic duct are normal caliber. The pancreatic duct is not dilated.  There is a thin accessory duct to communicating with the main pancreatic duct (ductus divisum) .  There are multiple small cystic lesions within the uncinate process of the pancreas, pancreatic body, and pancreatic tail.  There is best seen on coronal image 15 and 19, series 2.  There is no focal hepatic lesion.  The gallbladder is collapsed. The spleen is normal.  Adrenal glands appear normal.  Abdominal aorta is normal.  No skeletal lesion evident.  IMPRESSION:  1.  Complete replacement of the kidneys by innumerable cysts consistent with polycystic kidney disease. 2.  Extensive biliary ductal ectasia more severe within the left hepatic lobe but involving all hepatic ducts is  related to the polycystic kidney disease.  3.  Ductal ectasia and cystic change within the pancreatic head, body and tail also related to polycystic kidney disease. 4.  Ductus divisum variant anatomy.  No evidence of acute pancreatitis.  Original Report Authenticated By: Genevive Bi, M.D.   Mr 3d Recon At Scanner  05/19/2011  *RADIOLOGY REPORT*  Clinical Data: MRCP, chronic pancreatitis.  MRI ABDOMEN WITHOUT CONTRAST (MRCP)  Technique:  Multiplanar multisequence MR imaging of the abdomen was performed, including heavily T2-weighted images of the biliary and pancreatic ducts.  Three-dimensional MR images were rendered by post processing of the original MR data.  Comparison:  CT 05/14/2011, ultrasound 05/15/2011  Findings:  No pericardial fluid or pleural fluid.  There is near complete replacement  of the renal parenchyma by multiple renal cysts consistent with polycystic renal disease.  There is biliary ductal ectasia and small cystic lesions along the left and right intrahepatic biliary ducts.  There are several focal cystic ductal lesions measuring up to 12 mm within the left ductal system.  The ductal ectasia/cystic change is more  prominent within the lateral hepatic lobe (image 26, series 2).  There is no obstructing lesion evident at the confluence of the hepatic ducts. The common bile duct and common hepatic duct are normal caliber. The pancreatic duct is not dilated.  There is a thin accessory duct to communicating with the main pancreatic duct (ductus divisum) .  There are multiple small cystic lesions within the uncinate process of the pancreas, pancreatic body, and pancreatic tail.  There is best seen on coronal image 15 and 19, series 2.  There is no focal hepatic lesion.  The gallbladder is collapsed. The spleen is normal.  Adrenal glands appear normal.  Abdominal aorta is normal.  No skeletal lesion evident.  IMPRESSION:  1.  Complete replacement of the kidneys by innumerable cysts consistent with polycystic kidney disease. 2.  Extensive biliary ductal ectasia more severe within the left hepatic lobe but involving all hepatic ducts is  related to the polycystic kidney disease.  3.  Ductal ectasia and cystic change within the pancreatic head, body and tail also related to polycystic kidney disease. 4.  Ductus divisum variant anatomy.  No evidence of acute pancreatitis.  Original Report Authenticated By: Genevive Bi, M.D.   Dg Chest Port 1 View  06/02/2011  *RADIOLOGY REPORT*  Clinical Data: Pneumonia  PORTABLE CHEST - 1 VIEW  Comparison: 05/29/2011  Findings: Early airspace disease at the left base obscures the left hemidiaphragm.  Right lung is clear.  Normal heart size.  No pneumothorax.  Stable malpositioned left subclavian dialysis catheter.  IMPRESSION: New early airspace disease at the left base.  Original Report Authenticated By: Donavan Burnet, M.D.   Portable Chest Xray In Am  05/29/2011  *RADIOLOGY REPORT*  Clinical Data: Shortness of breath.  Neutropenic fever.  PORTABLE CHEST - 1 VIEW  Comparison: 05/28/2011  Findings: Stable appearance of the dialysis catheter noted with the distal tip projecting over the  right mediastinal margin with a transverse orientation.  Borderline cardiomegaly noted.  There is linear subsegmental atelectasis in the lingula, but otherwise the lungs appear clear.  IMPRESSION:  1.  Stable distal orientation of the dialysis catheter. 2.  Lingular subsegmental atelectasis. 3.  Borderline cardiomegaly.  Original Report Authenticated By: Dellia Cloud, M.D.   Dg Chest Portable 1 View  05/28/2011  *RADIOLOGY REPORT*  Clinical Data: Sepsis, fever  PORTABLE CHEST - 1 VIEW  Comparison: 05/14/2011  Findings: Lungs are clear. No pleural effusion or pneumothorax.  The heart is top normal in size.  Stable left subclavian dual lumen dialysis catheter.  IMPRESSION: No evidence of acute cardiopulmonary disease.  Original Report Authenticated By: Charline Bills, M.D.    Antibiotics:  Anti-infectives     Start     Dose/Rate Route Frequency Ordered Stop   06/04/11 1200   vancomycin (VANCOCIN) 750 mg in sodium chloride 0.9 % 150 mL IVPB        750 mg 150 mL/hr over 60 Minutes Intravenous Every M-W-F (Hemodialysis) 06/02/11 1241     06/02/11 1800   vancomycin (VANCOCIN) 50 mg/mL oral solution 125 mg        125 mg Oral 4 times daily 06/02/11 1330  06/02/11 1500   vancomycin (VANCOCIN) 1,500 mg in sodium chloride 0.9 % 500 mL IVPB        1,500 mg 250 mL/hr over 120 Minutes Intravenous  Once 06/02/11 1241 06/02/11 1638   06/02/11 1400   metroNIDAZOLE (FLAGYL) tablet 500 mg  Status:  Discontinued        500 mg Oral 3 times per day 06/02/11 1112 06/02/11 1331   06/02/11 1200   ceFEPIme (MAXIPIME) 2 g in dextrose 5 % 50 mL IVPB        2 g 100 mL/hr over 30 Minutes Intravenous Every M-W-F (Hemodialysis) 06/01/11 1117     06/01/11 1400   ciprofloxacin (CIPRO) IVPB 400 mg  Status:  Discontinued        400 mg 200 mL/hr over 60 Minutes Intravenous Every 24 hours 06/01/11 1133 06/01/11 1258   06/01/11 1200   ceFEPIme (MAXIPIME) 1 g in dextrose 5 % 50 mL IVPB        1 g 100 mL/hr  over 30 Minutes Intravenous NOW 06/01/11 1117 06/01/11 1314   05/31/11 1600   metroNIDAZOLE (FLAGYL) IVPB 500 mg  Status:  Discontinued        500 mg 100 mL/hr over 60 Minutes Intravenous Every 8 hours 05/31/11 1505 06/02/11 1112   05/31/11 1200   tobramycin (NEBCIN) 132 mg in dextrose 5 % 50 mL IVPB  Status:  Discontinued        132 mg 106.6 mL/hr over 30 Minutes Intravenous Every M-W-F (Hemodialysis) 05/29/11 0408 06/01/11 1115   05/30/11 1400   metroNIDAZOLE (FLAGYL) tablet 500 mg  Status:  Discontinued        500 mg Oral 3 times per day 05/30/11 1339 05/31/11 1505   05/29/11 0600   piperacillin-tazobactam (ZOSYN) IVPB 2.25 g  Status:  Discontinued        2.25 g 100 mL/hr over 30 Minutes Intravenous 3 times per day 05/29/11 0420 06/01/11 1115   05/29/11 0415   tobramycin (NEBCIN) 172 mg in dextrose 5 % 50 mL IVPB        172 mg 108.6 mL/hr over 30 Minutes Intravenous  Once 05/29/11 0408 05/29/11 0728   05/28/11 1400   vancomycin (VANCOCIN) IVPB 1000 mg/200 mL premix        1,000 mg 200 mL/hr over 60 Minutes Intravenous  Once 05/28/11 1356 05/28/11 1634   05/28/11 1400  piperacillin-tazobactam (ZOSYN) IVPB 3.375 g       3.375 g 12.5 mL/hr over 240 Minutes Intravenous  Once 05/28/11 1356 05/28/11 1833          Medications: Scheduled Meds:   . ceFEPime (MAXIPIME) IV  2 g Intravenous Q M,W,F-HD  . darbepoetin (ARANESP) injection - DIALYSIS  150 mcg Intravenous Q Wed-HD  . heparin  5,000 Units Subcutaneous Q8H  . lipase/protease/amylase  2 capsule Oral TID WC  . metoCLOPramide (REGLAN) injection  5 mg Intravenous Q8H  . vancomycin  1,500 mg Intravenous Once  . vancomycin  125 mg Oral QID  . vancomycin  750 mg Intravenous Q M,W,F-HD   Continuous Infusions:  PRN Meds:.sodium chloride, sodium chloride, acetaminophen, albuterol, diphenhydrAMINE, feeding supplement (NEPRO CARB STEADY), heparin, heparin, lidocaine, lidocaine-prilocaine, morphine injection, ondansetron (ZOFRAN)  IV, ondansetron, pentafluoroprop-tetrafluoroeth, promethazine  Assessment/Plan: Monica Guerra is a 25 y.o. 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 She has recently started hemodialysis in the last 5 weeks, however, has had difficulty with maintaining an HD  line due to occlusion, sShe was transferred from [facility] on 05/28[facility] after being found to have GNR bacteremia, later identified as enterobacter cloacae, likely from a left subclavian central catheter used for hemodialysis. She had fever (tmax 103 F), chills, myalgia and non-productive cough. She was started on empiric antibiotics and WBC was >10K. Antibiotics included Zosyn,Tobra (with HD),and Vanco. She has been subsquently found to have C difficile colitis and also neutropenia. Her HD line was removed on the 21st. She was simplified to cefepim for her enterobacter infection and oral vancomycin for her C difficile colitis. She was started on IV vancomyinc due to concer for opacities in lungs on portable film   Enterobacter Bacteremia: --will check cultures from [facility], or perhaps they were done at an HD center to check sensis. Would like to narrow her to ceftaz given her C difficile colitis, I would like to make sure she gets two weeks of IV ceftaz beyond removal of her HD catheter and with clean followup cultures  C difficile colitis: --continue oral vancomycin, would continue for 2 weeks AFTER she finsihes therapy for her enterobacter bacteremia  ?HCAP: --I am skeptical of this diagnosis --repat CXR upright portable    .  LOS: 6 days   Acey Lav 06/03/2011, 2:07 PM

## 2011-06-03 NOTE — Progress Notes (Signed)
S:Nauseated today O:BP 132/74  Pulse 83  Temp(Src) 99.4 F (37.4 C) (Oral)  Resp 18  Ht 4\' 11"  (1.499 m)  Wt 66.86 kg (147 lb 6.4 oz)  BMI 29.77 kg/m2  SpO2 96%  LMP 04/23/2011  Intake/Output Summary (Last 24 hours) at 06/03/11 1155 Last data filed at 06/02/11 1800  Gross per 24 hour  Intake    170 ml  Output      0 ml  Net    170 ml   Weight change: -0.648 kg (-1 lb 6.9 oz) [id number] ill appearing. Sitting on commode    . ceFEPime (MAXIPIME) IV  2 g Intravenous Q M,W,F-HD  . darbepoetin (ARANESP) injection - DIALYSIS  150 mcg Intravenous Q Wed-HD  . heparin  5,000 Units Subcutaneous Q8H  . lipase/protease/amylase  2 capsule Oral TID WC  . metoCLOPramide (REGLAN) injection  5 mg Intravenous Q8H  . vancomycin  1,500 mg Intravenous Once  . vancomycin  125 mg Oral QID  . vancomycin  750 mg Intravenous Q M,W,F-HD  . DISCONTD: metroNIDAZOLE  500 mg Oral Q8H  . DISCONTD: pantoprazole  40 mg Oral Q1200   Dg Chest Port 1 View  06/02/2011  *RADIOLOGY REPORT*  Clinical Data: Pneumonia  PORTABLE CHEST - 1 VIEW  Comparison: 05/29/2011  Findings: Early airspace disease at the left base obscures the left hemidiaphragm.  Right lung is clear.  Normal heart size.  No pneumothorax.  Stable malpositioned left subclavian dialysis catheter.  IMPRESSION: New early airspace disease at the left base.  Original Report Authenticated By: Donavan Burnet, M.D.   BMET  Lab 06/02/11 0849 06/01/11 0700 05/31/11 0530 05/30/11 0525 05/29/11 0630 05/28/11 2105 05/28/11 1344  NA 142 138 140 138 142 -- 144  K 3.7 3.8 4.7 4.7 4.9 -- 3.4*  CL 105 100 103 104 107 -- 107  CO2 22 22 19 22 26  -- 28  GLUCOSE 79 80 94 81 86 -- 91  BUN 29* 21 41* 30* 22 -- 15  CREATININE 7.83* 6.06* 8.86* 7.40* 5.92* 5.21* 4.47*  ALB -- -- -- -- -- -- --  CALCIUM 9.5 9.1 9.6 9.2 9.1 -- 9.6  PHOS -- -- -- -- -- 2.7 --   CBC  Lab 06/03/11 0630 06/02/11 0530 06/01/11 0700 05/31/11 0530  WBC 2.1* 1.9* 1.7* 1.2*  NEUTROABS  0.1* 0.1* 0.1* 0.1*  HGB 7.7* 8.0* 8.4* 8.6*  HCT 23.3* 24.3* 25.6* 26.7*  MCV 88.9 90.0 89.8 92.1  PLT 149* 143* 136* 142*     Assessment/Plan:  1) ESRD- new to dialysis 5 weeks ago.Will continue to dialyze MWF in the hospital. Eventual Plan is for Peritoneal Dialysis.  2) Catheter related Enterobacter cloacea Sepsis- On Maxipime..S/P catheter removal 3)C.Difficile Infection  4) Leukopenia -prob mutlifactorial, per hematology  5) HTN - amlodipine.  6) Anemia -begin ESA Rx (aranep--done)  7No Access  Watch without HD catheter another day or so, then replace.  Monica Guerra C

## 2011-06-04 ENCOUNTER — Encounter (HOSPITAL_COMMUNITY): Payer: Self-pay | Admitting: Anesthesiology

## 2011-06-04 ENCOUNTER — Inpatient Hospital Stay (HOSPITAL_COMMUNITY): Payer: PRIVATE HEALTH INSURANCE

## 2011-06-04 ENCOUNTER — Encounter (HOSPITAL_COMMUNITY)
Admission: EM | Disposition: A | Discharge status: Home / Self Care | Payer: Self-pay | Source: Home / Self Care | Attending: Internal Medicine

## 2011-06-04 ENCOUNTER — Inpatient Hospital Stay (HOSPITAL_COMMUNITY): Payer: PRIVATE HEALTH INSURANCE | Admitting: Anesthesiology

## 2011-06-04 HISTORY — PX: INSERTION OF DIALYSIS CATHETER: SHX1324

## 2011-06-04 LAB — DIFFERENTIAL
Band Neutrophils: 0 % (ref 0–10)
Basophils Absolute: 0 10*3/uL (ref 0.0–0.1)
Basophils Relative: 4 % — ABNORMAL HIGH (ref 0–1)
Basophils Relative: 0 % (ref 0–1)
Eosinophils Absolute: 0.1 10*3/uL (ref 0.0–0.7)
Eosinophils Absolute: 0.1 10*3/uL (ref 0.0–0.7)
Eosinophils Relative: 3 % (ref 0–5)
Eosinophils Relative: 4 % (ref 0–5)
Metamyelocytes Relative: 0 %
Monocytes Absolute: 0.8 10*3/uL (ref 0.1–1.0)
Monocytes Absolute: 0.4 10*3/uL (ref 0.1–1.0)
Monocytes Relative: 16 % — ABNORMAL HIGH (ref 3–12)
Myelocytes: 0 %
Neutro Abs: 0.2 10*3/uL — ABNORMAL LOW (ref 1.7–7.7)
nRBC: 2 /100 WBC — ABNORMAL HIGH

## 2011-06-04 LAB — CBC
HCT: 24.4 % — ABNORMAL LOW (ref 36.0–46.0)
HCT: 26.7 % — ABNORMAL LOW (ref 36.0–46.0)
Hemoglobin: 8.1 g/dL — ABNORMAL LOW (ref 12.0–15.0)
Hemoglobin: 8.8 g/dL — ABNORMAL LOW (ref 12.0–15.0)
MCH: 29.9 pg (ref 26.0–34.0)
MCHC: 33.2 g/dL (ref 30.0–36.0)
MCHC: 33 g/dL (ref 30.0–36.0)
MCV: 89.6 fL (ref 78.0–100.0)
RBC: 2.71 MIL/uL — ABNORMAL LOW (ref 3.87–5.11)
RDW: 15.1 % (ref 11.5–15.5)
WBC: 2.6 10*3/uL — ABNORMAL LOW (ref 4.0–10.5)

## 2011-06-04 LAB — RENAL FUNCTION PANEL
Albumin: 2.6 g/dL — ABNORMAL LOW (ref 3.5–5.2)
BUN: 21 mg/dL (ref 6–23)
Calcium: 9.7 mg/dL (ref 8.4–10.5)
Creatinine, Ser: 8.24 mg/dL — ABNORMAL HIGH (ref 0.50–1.10)
Glucose, Bld: 82 mg/dL (ref 70–99)
Phosphorus: 4.7 mg/dL — ABNORMAL HIGH (ref 2.3–4.6)
Potassium: 3.5 mEq/L (ref 3.5–5.1)

## 2011-06-04 LAB — BASIC METABOLIC PANEL
BUN: 23 mg/dL (ref 6–23)
Chloride: 105 mEq/L (ref 96–112)
Creatinine, Ser: 8.92 mg/dL — ABNORMAL HIGH (ref 0.50–1.10)
GFR calc Af Amer: 6 mL/min — ABNORMAL LOW (ref >90)
Glucose, Bld: 82 mg/dL (ref 70–99)
Potassium: 3.7 mEq/L (ref 3.5–5.1)

## 2011-06-04 LAB — CULTURE, BLOOD (SINGLE)

## 2011-06-04 SURGERY — INSERTION OF DIALYSIS CATHETER
Anesthesia: General | Site: Neck | Laterality: Right | Wound class: Clean

## 2011-06-04 MED ORDER — PROPOFOL 10 MG/ML IV EMUL
INTRAVENOUS | Status: DC | PRN
  Administered 2011-06-04: 200 mg via INTRAVENOUS

## 2011-06-04 MED ORDER — HEPARIN SODIUM (PORCINE) 1000 UNIT/ML DIALYSIS: 20.0000 [IU]/kg | INTRAMUSCULAR | Status: DC | PRN

## 2011-06-04 MED ORDER — DEXTROSE 5 % IV SOLN
2.0000 g | INTRAVENOUS | Status: AC
  Administered 2011-06-05: 2 g via INTRAVENOUS
  Filled 2011-06-04: qty 2

## 2011-06-04 MED ORDER — SACCHAROMYCES BOULARDII 250 MG PO CAPS
250.0000 mg | ORAL_CAPSULE | Freq: Two times a day (BID) | ORAL | Status: DC
  Administered 2011-06-05 – 2011-06-06 (×4): 250 mg via ORAL
  Filled 2011-06-04 (×7): qty 1

## 2011-06-04 MED ORDER — SODIUM CHLORIDE 0.9 % IR SOLN
Status: DC | PRN
  Administered 2011-06-04: 1000 mL

## 2011-06-04 MED ORDER — HEPARIN SODIUM (PORCINE) 1000 UNIT/ML IJ SOLN
INTRAMUSCULAR | Status: DC | PRN
  Administered 2011-06-04: 1000 [IU]

## 2011-06-04 MED ORDER — DEXTROSE 5 % IV SOLN
2.0000 g | Freq: Once | INTRAVENOUS | Status: AC
  Administered 2011-06-04: 2 g via INTRAVENOUS
  Filled 2011-06-04: qty 2

## 2011-06-04 MED ORDER — SODIUM CHLORIDE 0.9 % IR SOLN
Status: DC | PRN
  Administered 2011-06-04: 19:00:00

## 2011-06-04 MED ORDER — OXYCODONE-ACETAMINOPHEN 5-325 MG PO TABS
1.0000 | ORAL_TABLET | ORAL | Status: DC | PRN
  Administered 2011-06-05: 2 via ORAL

## 2011-06-04 MED ORDER — DEXTROSE 5 % IV SOLN
2.0000 g | INTRAVENOUS | Status: DC
  Administered 2011-06-07: 2 g via INTRAVENOUS
  Filled 2011-06-04: qty 2

## 2011-06-04 MED ORDER — LIDOCAINE-EPINEPHRINE (PF) 1 %-1:200000 IJ SOLN
INTRAMUSCULAR | Status: DC | PRN
  Administered 2011-06-04: 8 mL

## 2011-06-04 MED ORDER — MIDAZOLAM HCL 5 MG/5ML IJ SOLN
INTRAMUSCULAR | Status: DC | PRN
  Administered 2011-06-04: 2 mg via INTRAVENOUS

## 2011-06-04 SURGICAL SUPPLY — 42 items
CATH CANNON HEMO 15F 50CM (CATHETERS) IMPLANT
CATH CANNON HEMO 15FR 19 (HEMODIALYSIS SUPPLIES) IMPLANT
CATH CANNON HEMO 15FR 23CM (HEMODIALYSIS SUPPLIES) ×2 IMPLANT
CATH CANNON HEMO 15FR 31CM (HEMODIALYSIS SUPPLIES) IMPLANT
CATH CANNON HEMO 15FR 32CM (HEMODIALYSIS SUPPLIES) IMPLANT
CLOTH BEACON ORANGE TIMEOUT ST (SAFETY) ×2 IMPLANT
COVER PROBE W GEL 5X96 (DRAPES) IMPLANT
COVER SURGICAL LIGHT HANDLE (MISCELLANEOUS) ×2 IMPLANT
DERMABOND ADHESIVE PROPEN (GAUZE/BANDAGES/DRESSINGS) ×1
DERMABOND ADVANCED .7 DNX6 (GAUZE/BANDAGES/DRESSINGS) ×1 IMPLANT
DRAPE C-ARM 42X72 X-RAY (DRAPES) ×2 IMPLANT
DRAPE CHEST BREAST 15X10 FENES (DRAPES) ×2 IMPLANT
GAUZE SPONGE 2X2 8PLY STRL LF (GAUZE/BANDAGES/DRESSINGS) ×1 IMPLANT
GAUZE SPONGE 4X4 16PLY XRAY LF (GAUZE/BANDAGES/DRESSINGS) ×2 IMPLANT
GLOVE BIO SURGEON STRL SZ7.5 (GLOVE) ×2 IMPLANT
GLOVE BIOGEL PI IND STRL 6.5 (GLOVE) ×1 IMPLANT
GLOVE BIOGEL PI IND STRL 7.5 (GLOVE) ×1 IMPLANT
GLOVE BIOGEL PI INDICATOR 6.5 (GLOVE) ×1
GLOVE BIOGEL PI INDICATOR 7.5 (GLOVE) ×1
GLOVE ECLIPSE 6.5 STRL STRAW (GLOVE) ×2 IMPLANT
GOWN STRL NON-REIN LRG LVL3 (GOWN DISPOSABLE) ×4 IMPLANT
KIT BASIN OR (CUSTOM PROCEDURE TRAY) ×2 IMPLANT
KIT ROOM TURNOVER OR (KITS) ×2 IMPLANT
NEEDLE 18GX1X1/2 (RX/OR ONLY) (NEEDLE) ×2 IMPLANT
NEEDLE 22X1 1/2 (OR ONLY) (NEEDLE) ×2 IMPLANT
NEEDLE HYPO 25GX1X1/2 BEV (NEEDLE) ×2 IMPLANT
NS IRRIG 1000ML POUR BTL (IV SOLUTION) ×2 IMPLANT
PACK SURGICAL SETUP 50X90 (CUSTOM PROCEDURE TRAY) ×2 IMPLANT
PAD ARMBOARD 7.5X6 YLW CONV (MISCELLANEOUS) ×2 IMPLANT
SOAP 2 % CHG 4 OZ (WOUND CARE) ×2 IMPLANT
SPONGE GAUZE 2X2 STER 10/PKG (GAUZE/BANDAGES/DRESSINGS) ×1
SUT ETHILON 3 0 PS 1 (SUTURE) ×2 IMPLANT
SUT VICRYL 4-0 PS2 18IN ABS (SUTURE) ×2 IMPLANT
SYR 20CC LL (SYRINGE) ×4 IMPLANT
SYR 30ML LL (SYRINGE) IMPLANT
SYR 5ML LL (SYRINGE) ×4 IMPLANT
SYR CONTROL 10ML LL (SYRINGE) ×2 IMPLANT
SYRINGE 10CC LL (SYRINGE) ×2 IMPLANT
TAPE CLOTH SURG 4X10 WHT LF (GAUZE/BANDAGES/DRESSINGS) ×2 IMPLANT
TOWEL OR 17X24 6PK STRL BLUE (TOWEL DISPOSABLE) ×2 IMPLANT
TOWEL OR 17X26 10 PK STRL BLUE (TOWEL DISPOSABLE) ×2 IMPLANT
WATER STERILE IRR 1000ML POUR (IV SOLUTION) ×2 IMPLANT

## 2011-06-04 NOTE — Op Note (Signed)
06/04/2011  PREOP DIAGNOSIS: chronic kidney disease  POSTOP DIAGNOSIS: chronic kidney disease  PROCEDURE: ultrasound guided placement of right IJ dietetic catheter (23 cm cuff to tip)  SURGEON: Monica Kindle. Edilia Bo, MD, FACS  ASSIST: none  ANESTHESIA: Gen.   EBL: minimal  FINDINGS: patent right IJ  INDICATIONS: this is a pleasant 25 year old woman who had a infected catheter removed 2 days ago we were asked to place a new catheter.  TECHNIQUE: The patient was taken to the operating room and received a general anesthetic. The neck and upper chest were prepped and draped in the usual sterile fashion. After the skin was anesthetized with 1% lidocaine, and under ultrasound guidance, the right IJ was cannulated and a guidewire introduced into the superior vena cava under fluoroscopic control. The tract over the wire was dilated and then the dilator and peel-away sheath were passed over the wire and the wire and dilator removed. Catheter was passed through the peel-away sheath and positioned in the right atrium. The exit site for the catheter was selected and the skin anesthetized between the 2 areas.  The catheter was then brought through the tunnel cut to the appropriate length and the distal ports were attached. Both ports withdrew easily and were then flushed with heparinized saline and filled with concentrated heparin. The cath was secured at its exit site with a 3-0 nylon suture. The IJ cannulation site was closed with a 4-0 subcuticular stitch. Sterile dressing was applied the patient tolerated she's well and was transferred to the recovery room in stable condition. All needle and sponge counts were correct.     Monica Ferrari, MD, FACS [facility]  DATE OF OPERATION: 06/04/2011 DATE OF DICTATION: 06/04/2011

## 2011-06-04 NOTE — Anesthesia Preprocedure Evaluation (Addendum)
Anesthesia Evaluation  Patient identified by MRN, date of birth, ID band Patient awake    Reviewed: Allergy & Precautions, H&P , NPO status , Patient's Chart, lab work & pertinent test results  Airway Mallampati: I TM Distance: >3 FB Neck ROM: Full    Dental  (+) Dental Advisory Given and Teeth Intact   Pulmonary    Pulmonary exam normal       Cardiovascular hypertension, Pt. on medications     Neuro/Psych    GI/Hepatic   Endo/Other    Renal/GU CRFRenal disease     Musculoskeletal   Abdominal   Peds  Hematology   Anesthesia Other Findings   Reproductive/Obstetrics                       Anesthesia Physical Anesthesia Plan  ASA: III  Anesthesia Plan: General   Post-op Pain Management:    Induction: Intravenous  Airway Management Planned: LMA  Additional Equipment:   Intra-op Plan:   Post-operative Plan: Extubation in OR  Informed Consent: I have reviewed the patients History and Physical, chart, labs and discussed the procedure including the risks, benefits and alternatives for the proposed anesthesia with the patient or authorized representative who has indicated his/her understanding and acceptance.   Dental advisory given  Plan Discussed with: Surgeon and CRNA  Anesthesia Plan Comments:         Anesthesia Quick Evaluation

## 2011-06-04 NOTE — Progress Notes (Signed)
Subjective: Anxious about placement of her dialysis catheter  Objective: Vital signs in last 24 hours: Filed Vitals:   06/03/11 0632 06/03/11 1402 06/03/11 2228 06/04/11 0623  BP: 132/74 134/86 136/95 125/80  Pulse: 83 96 89 75  Temp: 99.4 F (37.4 C) 99 F (37.2 C) 98.8 F (37.1 C) 98.5 F (36.9 C)  TempSrc:  Oral    Resp: 18 20 18 16   Height:      Weight: 66.86 kg (147 lb 6.4 oz)   66.86 kg (147 lb 6.4 oz)  SpO2: 96% 97% 99% 97%    Intake/Output Summary (Last 24 hours) at 06/04/11 1021 Last data filed at 06/04/11 0857  Gross per 24 hour  Intake    482 ml  Output      0 ml  Net    482 ml    Weight change: -0.94 kg (-2 lb 1.2 oz)  General: Alert, awake, oriented x3, in no acute distress. HEENT: No bruits, no goiter. Heart: Regular rate and rhythm, without murmurs, rubs, gallops. Lungs: Clear to auscultation bilaterally. Abdomen: Soft, nontender, nondistended, positive bowel sounds. Extremities: No clubbing cyanosis or edema with positive pedal pulses. Neuro: Grossly intact, nonfocal.    Lab Results: No results found for this or any previous visit (from the past 24 hour(s)).   Micro: Recent Results (from the past 240 hour(s))  CULTURE, BLOOD (ROUTINE X 2)     Status: Normal   Collection Time   05/28/11  1:58 PM      Component Value Range Status Comment   Specimen Description BLOOD LEFT FOREARM   Final    Special Requests BOTTLES DRAWN AEROBIC AND ANAEROBIC 4CC EACH   Final    Culture NO GROWTH 5 DAYS   Final    Report Status 06/02/2011 FINAL   Final   CULTURE, BLOOD (ROUTINE X 2)     Status: Normal   Collection Time   05/28/11  2:14 PM      Component Value Range Status Comment   Specimen Description BLOOD RIGHT ARM   Final    Special Requests BOTTLES DRAWN AEROBIC AND ANAEROBIC 6CC   Final    Culture NO GROWTH 5 DAYS   Final    Report Status 06/02/2011 FINAL   Final   MRSA PCR SCREENING     Status: Normal   Collection Time   05/28/11  8:04 PM   Component Value Range Status Comment   MRSA by PCR NEGATIVE  NEGATIVE  Final   CULTURE, BLOOD (SINGLE)     Status: Normal   Collection Time   05/29/11  4:25 PM      Component Value Range Status Comment   Specimen Description BLOOD HEMODIALYSIS CATHETER   Final    Special Requests BOTTLES DRAWN AEROBIC AND ANAEROBIC 5CC   Final    Setup Time [id number]   Final    Culture NO GROWTH 5 DAYS   Final    Report Status 06/04/2011 FINAL   Final   CLOSTRIDIUM DIFFICILE BY PCR     Status: Abnormal   Collection Time   05/30/11 10:32 AM      Component Value Range Status Comment   C difficile by pcr POSITIVE (*) NEGATIVE  Final   PATHOLOGIST SMEAR REVIEW     Status: Normal   Collection Time   06/01/11  7:00 AM      Component Value Range Status Comment   Tech Review Severe absolute neutropenia   Final   CULTURE, BLOOD (ROUTINE  X 2)     Status: Normal (Preliminary result)   Collection Time   06/02/11 11:26 AM      Component Value Range Status Comment   Specimen Description BLOOD CENTRAL LINE   Final    Special Requests BOTTLES DRAWN AEROBIC AND ANAEROBIC 10CC   Final    Setup Time [id number]   Final    Culture     Final    Value:        BLOOD CULTURE RECEIVED NO GROWTH TO DATE CULTURE WILL BE HELD FOR 5 DAYS BEFORE ISSUING A FINAL NEGATIVE REPORT   Report Status PENDING   Incomplete   CULTURE, BLOOD (ROUTINE X 2)     Status: Normal (Preliminary result)   Collection Time   06/02/11 11:28 AM      Component Value Range Status Comment   Specimen Description BLOOD CENTRAL LINE   Final    Special Requests BOTTLES DRAWN AEROBIC AND ANAEROBIC 10CC   Final    Setup Time [id number]   Final    Culture     Final    Value:        BLOOD CULTURE RECEIVED NO GROWTH TO DATE CULTURE WILL BE HELD FOR 5 DAYS BEFORE ISSUING A FINAL NEGATIVE REPORT   Report Status PENDING   Incomplete   CATH TIP CULTURE     Status: Normal (Preliminary result)   Collection Time   06/02/11  4:30 PM      Component Value  Range Status Comment   Specimen Description CATH TIP   Final    Special Requests NONE   Final    Culture NO GROWTH 1 DAY   Final    Report Status PENDING   Incomplete     Studies/Results: Dg Chest 1 View  06/03/2011  *RADIOLOGY REPORT*  Clinical Data: Cough.  CHEST - 1 VIEW  Comparison: Plain films of the chest 06/02/2011.  Findings: Dialysis catheter has been removed.  Again seen is left lower lobe airspace disease consistent with pneumonia.  Right lung is clear.  Heart size normal.  No pneumothorax or pleural effusion.  IMPRESSION: Left lower lobe pneumonia.  Original Report Authenticated By: Bernadene Bell. Maricela Curet, M.D.   Dg Chest Port 1 View  06/02/2011  *RADIOLOGY REPORT*  Clinical Data: Pneumonia  PORTABLE CHEST - 1 VIEW  Comparison: 05/29/2011  Findings: Early airspace disease at the left base obscures the left hemidiaphragm.  Right lung is clear.  Normal heart size.  No pneumothorax.  Stable malpositioned left subclavian dialysis catheter.  IMPRESSION: New early airspace disease at the left base.  Original Report Authenticated By: Donavan Burnet, M.D.    Medications:     . ceFEPime (MAXIPIME) IV  2 g Intravenous Q M,W,F-HD  . darbepoetin (ARANESP) injection - DIALYSIS  150 mcg Intravenous Q Wed-HD  . heparin  5,000 Units Subcutaneous Q8H  . lipase/protease/amylase  2 capsule Oral TID WC  . metoCLOPramide (REGLAN) injection  5 mg Intravenous Q8H  . vancomycin  125 mg Oral QID  . vancomycin  750 mg Intravenous Q M,W,F-HD       . ceFEPime (MAXIPIME) IV  2 g Intravenous Q M,W,F-HD  . darbepoetin (ARANESP) injection - DIALYSIS  150 mcg Intravenous Q Wed-HD  . heparin  5,000 Units Subcutaneous Q8H  . lipase/protease/amylase  2 capsule Oral TID WC  . metoCLOPramide (REGLAN) injection  5 mg Intravenous Q8H  . vancomycin  125 mg Oral QID  . vancomycin  750 mg Intravenous Q  M,W,F-HD     Assessment:  1) ESRD- new to dialysis 5 weeks ago.Will continue to dialyze MWF in the  hospital. Eventual Plan is for Peritoneal Dialysis. Hemodialysis catheter removed .Patient would need a tunneled dialysis catheter placed before Monday.  2) fever/Enterobacter cloacea Sepsis- On Maxipime.. ? Gentamicin discontinued, catheter relater versus other source..since GNR .Recurrent fever.Will add vancomycin . X-ray suggestive of left lower lobe infiltrate. Dr. Jerolyn Center has seen the patient, and she recommends to treat with cefepime for 14 days, and to insure that her bacteremia has cleared up.  3.) C.Difficile ID recommends vancomycin 125 mg 4 times a day for 10 days  4) Leukopenia neutropenia, ANC still less than 500. -prob mutlifactorial, per hematology  5) HTN - amlodipine discontinued, as blood pressure low last night.  6) Anemia -begin ESA Rx (aranep--done) ,[name], MD, consulted, at this time there is no indication to start Neupogen, conservator be resolving spontaneously, check daily CBC with differential,   #7 nausea likely secondary to medications for such as by mouth vancomycin, will start her on Reglan scheduled to see this helps.  Disposition the patient has been kept n.p.o., in anticipation of hemodialysis catheter placement today.   LOS: 7 days   [facility] 06/04/2011, 10:21 AM

## 2011-06-04 NOTE — Progress Notes (Signed)
Pt HR dropped to 49 15 minutes ago. Vitals were checked. BP 152/73 and HR is now 57. Dr, Susie Cassette notified.

## 2011-06-04 NOTE — H&P (View-Only) (Signed)
[facility]  Daily Progress Note   No complications from TDC removal overnight.  Dr. Dickson will be covering for the group today.  Please let us know if and when the patient will need another tunneled dialysis catheter placed.  I suspect a temporary dialysis catheter may be in her best interest in the immediate time period.  CBC    Component Value Date/Time   WBC 1.9* 06/02/2011 0530   RBC 2.70* 06/02/2011 0530   HGB 8.0* 06/02/2011 0530   HCT 24.3* 06/02/2011 0530   PLT 143* 06/02/2011 0530   MCV 90.0 06/02/2011 0530   MCH 29.6 06/02/2011 0530   MCHC 32.9 06/02/2011 0530   RDW 15.3 06/02/2011 0530   LYMPHSABS 1.2 06/02/2011 0530   MONOABS 0.4 06/02/2011 0530   EOSABS 0.2 06/02/2011 0530   BASOSABS 0.0 06/02/2011 0530   BMET    Component Value Date/Time   NA 142 06/02/2011 0849   K 3.7 06/02/2011 0849   CL 105 06/02/2011 0849   CO2 22 06/02/2011 0849   GLUCOSE 79 06/02/2011 0849   BUN 29* 06/02/2011 0849   CREATININE 7.83* 06/02/2011 0849   CALCIUM 9.5 06/02/2011 0849   GFRNONAA 7* 06/02/2011 0849   GFRAA 8* 06/02/2011 0849   Blood Culture    Component Value Date/Time   SDES BLOOD HEMODIALYSIS CATHETER 05/29/2011 1625   SPECREQUEST BOTTLES DRAWN AEROBIC AND ANAEROBIC 5CC 05/29/2011 1625   CULT        BLOOD CULTURE RECEIVED NO GROWTH TO DATE CULTURE WILL BE HELD FOR 5 DAYS BEFORE ISSUING A FINAL NEGATIVE REPORT 05/29/2011 1625   REPTSTATUS PENDING 05/29/2011 1625   Tzirel Leonor, MD [facility] Office: [phone] Pager: [phone]  06/03/2011, 6:24 AM 

## 2011-06-04 NOTE — Anesthesia Procedure Notes (Signed)
Procedure Name: LMA Insertion Date/Time: 06/04/2011 6:04 PM Performed by: Alanda Amass Pre-anesthesia Checklist: Patient identified, Emergency Drugs available, Suction available, Patient being monitored and Timeout performed Patient Re-evaluated:Patient Re-evaluated prior to inductionOxygen Delivery Method: Circle System Utilized Preoxygenation: Pre-oxygenation with 100% oxygen Intubation Type: IV induction Ventilation: Mask ventilation without difficulty LMA: LMA inserted Tube type: Oral Tube size: 4.0 mm Number of attempts: 1 Placement Confirmation: positive ETCO2 and breath sounds checked- equal and bilateral Tube secured with: Tape Dental Injury: Teeth and Oropharynx as per pre-operative assessment

## 2011-06-04 NOTE — Progress Notes (Signed)
Asked to place Diatek catheter on patient. Dr. Imogene Burn had recommended a temporary catheter. However, Dr. Lowell Guitar would like [place] to proceed with a Diatek catheter today. Fortunately she has been NPO (our service was not aware that Renal wanted catheter today). I have discussed the procedure and risks with patient. She is agreeable to proceed tonight.  Di Kindle. Edilia Bo, MD, FACS Beeper [phone] 06/04/2011

## 2011-06-04 NOTE — Transfer of Care (Signed)
Immediate Anesthesia Transfer of Care Note  Patient: Monica Guerra  Procedure(s) Performed:  INSERTION OF DIALYSIS CATHETER - 28cm dialysis catheter placed in Right Internal Jugular  Patient Location: PACU  Anesthesia Type: General  Level of Consciousness: awake  Airway & Oxygen Therapy: Patient Spontanous Breathing  Post-op Assessment: Report given to PACU RN  Post vital signs: stable  Complications: No apparent anesthesia complications

## 2011-06-04 NOTE — Progress Notes (Signed)
ANTIBIOTIC CONSULT NOTE - FOLLOW UP  Pharmacy Consult for Cefepime/Vancomycin Indication: Enterobacter bacteremia/Questionable PNA  Allergies  Allergen Reactions  . Vancomycin Itching    Patient usually takes Benadryl Before.     Patient Measurements: Height: 4\' 11"  (149.9 cm) Weight: 147 lb 6.4 oz (66.86 kg) (scale B) IBW/kg (Calculated) : 43.2  Adjusted Body Weight:   Vital Signs: Temp: 98.5 F (36.9 C) (11/23 0623) BP: 125/80 mmHg (11/23 0623) Pulse Rate: 75  (11/23 0623) Intake/Output from previous day: 11/22 0701 - 11/23 0700 In: 482 [P.O.:480; IV Piggyback:2] Out: -  Intake/Output from this shift:    Labs:  Basename 06/03/11 0630 06/02/11 0849 06/02/11 0530  WBC 2.1* -- 1.9*  HGB 7.7* -- 8.0*  PLT 149* -- 143*  LABCREA -- -- --  CREATININE -- 7.83* --   Estimated Creatinine Clearance: 9.3 ml/min (by C-G formula based on Cr of 7.83). No results found for this basename: VANCOTROUGH:2,VANCOPEAK:2,VANCORANDOM:2,GENTTROUGH:2,GENTPEAK:2,GENTRANDOM:2,TOBRATROUGH:2,TOBRAPEAK:2,TOBRARND:2,AMIKACINPEAK:2,AMIKACINTROU:2,AMIKACIN:2, in the last 72 hours   Microbiology: Recent Results (from the past 720 hour(s))  CULTURE, BLOOD (ROUTINE X 2)     Status: Normal   Collection Time   05/14/11  6:00 PM      Component Value Range Status Comment   Specimen Description BLOOD LEFT ARM   Final    Special Requests BOTTLES DRAWN AEROBIC AND ANAEROBIC 6CC   Final    Culture NO GROWTH 5 DAYS   Final    Report Status 05/19/2011 FINAL   Final   CULTURE, BLOOD (ROUTINE X 2)     Status: Normal   Collection Time   05/14/11  6:30 PM      Component Value Range Status Comment   Specimen Description BLOOD RIGHT ARM   Final    Special Requests BOTTLES DRAWN AEROBIC AND ANAEROBIC 6CC   Final    Culture NO GROWTH 5 DAYS   Final    Report Status 05/19/2011 FINAL   Final   URINE CULTURE     Status: Normal   Collection Time   05/14/11  6:43 PM      Component Value Range Status Comment   Specimen Description URINE, CLEAN CATCH   Final    Special Requests NONE   Final    Setup Time [id number]   Final    Colony Count NO GROWTH   Final    Culture NO GROWTH   Final    Report Status 05/15/2011 FINAL   Final   URINE CULTURE     Status: Normal   Collection Time   05/15/11  1:04 AM      Component Value Range Status Comment   Specimen Description URINE, CLEAN CATCH   Final    Special Requests NONE   Final    Setup Time [id number]   Final    Colony Count NO GROWTH   Final    Culture NO GROWTH   Final    Report Status 05/16/2011 FINAL   Final   MRSA PCR SCREENING     Status: Normal   Collection Time   05/15/11  1:12 AM      Component Value Range Status Comment   MRSA by PCR NEGATIVE  NEGATIVE  Final   CLOSTRIDIUM DIFFICILE BY PCR     Status: Normal   Collection Time   05/15/11  1:15 AM      Component Value Range Status Comment   C difficile by pcr NEGATIVE  NEGATIVE  Final   CULTURE, BLOOD (ROUTINE X 2)  Status: Normal   Collection Time   05/28/11  1:58 PM      Component Value Range Status Comment   Specimen Description BLOOD LEFT FOREARM   Final    Special Requests BOTTLES DRAWN AEROBIC AND ANAEROBIC 4CC EACH   Final    Culture NO GROWTH 5 DAYS   Final    Report Status 06/02/2011 FINAL   Final   CULTURE, BLOOD (ROUTINE X 2)     Status: Normal   Collection Time   05/28/11  2:14 PM      Component Value Range Status Comment   Specimen Description BLOOD RIGHT ARM   Final    Special Requests BOTTLES DRAWN AEROBIC AND ANAEROBIC 6CC   Final    Culture NO GROWTH 5 DAYS   Final    Report Status 06/02/2011 FINAL   Final   MRSA PCR SCREENING     Status: Normal   Collection Time   05/28/11  8:04 PM      Component Value Range Status Comment   MRSA by PCR NEGATIVE  NEGATIVE  Final   CULTURE, BLOOD (SINGLE)     Status: Normal   Collection Time   05/29/11  4:25 PM      Component Value Range Status Comment   Specimen Description BLOOD HEMODIALYSIS CATHETER   Final     Special Requests BOTTLES DRAWN AEROBIC AND ANAEROBIC 5CC   Final    Setup Time [id number]   Final    Culture NO GROWTH 5 DAYS   Final    Report Status 06/04/2011 FINAL   Final   CLOSTRIDIUM DIFFICILE BY PCR     Status: Abnormal   Collection Time   05/30/11 10:32 AM      Component Value Range Status Comment   C difficile by pcr POSITIVE (*) NEGATIVE  Final   PATHOLOGIST SMEAR REVIEW     Status: Normal   Collection Time   06/01/11  7:00 AM      Component Value Range Status Comment   Tech Review Severe absolute neutropenia   Final   CULTURE, BLOOD (ROUTINE X 2)     Status: Normal (Preliminary result)   Collection Time   06/02/11 11:26 AM      Component Value Range Status Comment   Specimen Description BLOOD CENTRAL LINE   Final    Special Requests BOTTLES DRAWN AEROBIC AND ANAEROBIC 10CC   Final    Setup Time [id number]   Final    Culture     Final    Value:        BLOOD CULTURE RECEIVED NO GROWTH TO DATE CULTURE WILL BE HELD FOR 5 DAYS BEFORE ISSUING A FINAL NEGATIVE REPORT   Report Status PENDING   Incomplete   CULTURE, BLOOD (ROUTINE X 2)     Status: Normal (Preliminary result)   Collection Time   06/02/11 11:28 AM      Component Value Range Status Comment   Specimen Description BLOOD CENTRAL LINE   Final    Special Requests BOTTLES DRAWN AEROBIC AND ANAEROBIC 10CC   Final    Setup Time [id number]   Final    Culture     Final    Value:        BLOOD CULTURE RECEIVED NO GROWTH TO DATE CULTURE WILL BE HELD FOR 5 DAYS BEFORE ISSUING A FINAL NEGATIVE REPORT   Report Status PENDING   Incomplete   CATH TIP CULTURE     Status: Normal (Preliminary result)  Collection Time   06/02/11  4:30 PM      Component Value Range Status Comment   Specimen Description CATH TIP   Final    Special Requests NONE   Final    Culture NO GROWTH 1 DAY   Final    Report Status PENDING   Incomplete     Anti-infectives     Start     Dose/Rate Route Frequency Ordered Stop   06/04/11 1200    vancomycin (VANCOCIN) 750 mg in sodium chloride 0.9 % 150 mL IVPB        750 mg 150 mL/hr over 60 Minutes Intravenous Every M-W-F (Hemodialysis) 06/02/11 1241     06/02/11 1800   vancomycin (VANCOCIN) 50 mg/mL oral solution 125 mg        125 mg Oral 4 times daily 06/02/11 1330     06/02/11 1500   vancomycin (VANCOCIN) 1,500 mg in sodium chloride 0.9 % 500 mL IVPB        1,500 mg 250 mL/hr over 120 Minutes Intravenous  Once 06/02/11 1241 06/02/11 1638   06/02/11 1400   metroNIDAZOLE (FLAGYL) tablet 500 mg  Status:  Discontinued        500 mg Oral 3 times per day 06/02/11 1112 06/02/11 1331   06/02/11 1200   ceFEPIme (MAXIPIME) 2 g in dextrose 5 % 50 mL IVPB        2 g 100 mL/hr over 30 Minutes Intravenous Every M-W-F (Hemodialysis) 06/01/11 1117     06/01/11 1400   ciprofloxacin (CIPRO) IVPB 400 mg  Status:  Discontinued        400 mg 200 mL/hr over 60 Minutes Intravenous Every 24 hours 06/01/11 1133 06/01/11 1258   06/01/11 1200   ceFEPIme (MAXIPIME) 1 g in dextrose 5 % 50 mL IVPB        1 g 100 mL/hr over 30 Minutes Intravenous NOW 06/01/11 1117 06/01/11 1314   05/31/11 1600   metroNIDAZOLE (FLAGYL) IVPB 500 mg  Status:  Discontinued        500 mg 100 mL/hr over 60 Minutes Intravenous Every 8 hours 05/31/11 1505 06/02/11 1112   05/31/11 1200   tobramycin (NEBCIN) 132 mg in dextrose 5 % 50 mL IVPB  Status:  Discontinued        132 mg 106.6 mL/hr over 30 Minutes Intravenous Every M-W-F (Hemodialysis) 05/29/11 0408 06/01/11 1115   05/30/11 1400   metroNIDAZOLE (FLAGYL) tablet 500 mg  Status:  Discontinued        500 mg Oral 3 times per day 05/30/11 1339 05/31/11 1505   05/29/11 0600   piperacillin-tazobactam (ZOSYN) IVPB 2.25 g  Status:  Discontinued        2.25 g 100 mL/hr over 30 Minutes Intravenous 3 times per day 05/29/11 0420 06/01/11 1115   05/29/11 0415   tobramycin (NEBCIN) 172 mg in dextrose 5 % 50 mL IVPB        172 mg 108.6 mL/hr over 30 Minutes Intravenous  Once  05/29/11 0408 05/29/11 0728   05/28/11 1400   vancomycin (VANCOCIN) IVPB 1000 mg/200 mL premix        1,000 mg 200 mL/hr over 60 Minutes Intravenous  Once 05/28/11 1356 05/28/11 1634   05/28/11 1400  piperacillin-tazobactam (ZOSYN) IVPB 3.375 g       3.375 g 12.5 mL/hr over 240 Minutes Intravenous  Once 05/28/11 1356 05/28/11 1833          Assessment/Plan 1.Enterobacter bacteremia-cefepime day#3, currently  renal dose adjusted and set to receive post HD MWF. Noted plans for changing to fortaz by ID. 2.HCAP-therapy broadened to vancomycin post dialysis day#3. Will continue current dosing of 750 qhd and plan on trough level next week if continues. 3.HD catheter placement pending 4.Cdiff-cont. Po vanc  Severiano Gilbert 06/04/2011,10:49 AM

## 2011-06-04 NOTE — Interval H&P Note (Signed)
History and Physical Interval Note:   06/04/2011   4:30 PM   Monica Guerra  has presented today for surgery, with the diagnosis of End Stage Renal Disease  The various methods of treatment have been discussed with the patient and family. After consideration of risks, benefits and other options for treatment, the patient has consented to  Procedure(s): INSERTION OF DIALYSIS CATHETER as a surgical intervention .  The patients' history has been reviewed, patient examined, no change in status, stable for surgery.  I have reviewed the patients' chart and labs.  Questions were answered to the patient's satisfaction.     DICKSON,CHRISTOPHER S  MD

## 2011-06-04 NOTE — Progress Notes (Signed)
SShe is NPO for apparent new PC O:BP 152/73  Pulse 58  Temp(Src) 98.5 F (36.9 C) (Oral)  Resp 16  Ht 4\' 11"  (1.499 m)  Wt 66.86 kg (147 lb 6.4 oz)  BMI 29.77 kg/m2  SpO2 98%  LMP 04/23/2011  Intake/Output Summary (Last 24 hours) at 06/04/11 1224 Last data filed at 06/04/11 0857  Gross per 24 hour  Intake    482 ml  Output      0 ml  Net    482 ml   Weight change: -0.94 kg (-2 lb 1.2 oz) ZOX:WRUE appearing CVS:RRR Resp:clear AVW:UJWJ Ext:tr edema    . ceFEPime (MAXIPIME) IV  2 g Intravenous Q M,W,F-HD  . darbepoetin (ARANESP) injection - DIALYSIS  150 mcg Intravenous Q Wed-HD  . heparin  5,000 Units Subcutaneous Q8H  . lipase/protease/amylase  2 capsule Oral TID WC  . metoCLOPramide (REGLAN) injection  5 mg Intravenous Q8H  . vancomycin  125 mg Oral QID  . vancomycin  750 mg Intravenous Q M,W,F-HD   Dg Chest 1 View  06/03/2011  *RADIOLOGY REPORT*  Clinical Data: Cough.  CHEST - 1 VIEW  Comparison: Plain films of the chest 06/02/2011.  Findings: Dialysis catheter has been removed.  Again seen is left lower lobe airspace disease consistent with pneumonia.  Right lung is clear.  Heart size normal.  No pneumothorax or pleural effusion.  IMPRESSION: Left lower lobe pneumonia.  Original Report Authenticated By: Bernadene Bell. Maricela Curet, M.D.   Dg Chest Port 1 View  06/02/2011  *RADIOLOGY REPORT*  Clinical Data: Pneumonia  PORTABLE CHEST - 1 VIEW  Comparison: 05/29/2011  Findings: Early airspace disease at the left base obscures the left hemidiaphragm.  Right lung is clear.  Normal heart size.  No pneumothorax.  Stable malpositioned left subclavian dialysis catheter.  IMPRESSION: New early airspace disease at the left base.  Original Report Authenticated By: Donavan Burnet, M.D.   BMET  Lab 06/04/11 1015 06/02/11 0849 06/01/11 0700 05/31/11 0530 05/30/11 0525 05/29/11 0630 05/28/11 2105 05/28/11 1344  NA 141 142 138 140 138 142 -- 144  K 3.5 3.7 3.8 4.7 4.7 4.9 -- 3.4*  CL 105 105  100 103 104 107 -- 107  CO2 21 22 22 19 22 26  -- 28  GLUCOSE 82 79 80 94 81 86 -- 91  BUN 21 29* 21 41* 30* 22 -- 15  CREATININE 8.24* 7.83* 6.06* 8.86* 7.40* 5.92* 5.21* --  ALB -- -- -- -- -- -- -- --  CALCIUM 9.7 9.5 9.1 9.6 9.2 9.1 -- 9.6  PHOS 4.7* -- -- -- -- -- 2.7 --   CBC  Lab 06/04/11 1015 06/03/11 0630 06/02/11 0530 06/01/11 0700  WBC 2.6* 2.1* 1.9* 1.7*  NEUTROABS 0.2* 0.1* 0.1* 0.1*  HGB 8.1* 7.7* 8.0* 8.4*  HCT 24.4* 23.3* 24.3* 25.6*  MCV 90.0 88.9 90.0 89.8  PLT 181 149* 143* 136*     Assessment/Plan: 1) ESRD- new to dialysis 5 weeks ago Eventual Plan is for Peritoneal Dialysis.  2) Catheter related Enterobacter cloacea Sepsis- On Maxipime..S/P catheter removal & replacement today  3)C.Difficile Infection  4) Leukopenia -prob mutlifactorial, improving 5) HTN - amlodipine.  6) Anemia - ESA Rx   Next HD off schedule for Sat    Sonny Poth C

## 2011-06-04 NOTE — Progress Notes (Signed)
Subjective: In better spirits, with Mom today  Objective: Weight change: -2 lb 1.2 oz (-0.94 kg)  Intake/Output Summary (Last 24 hours) at 06/04/11 1731 Last data filed at 06/04/11 1236  Gross per 24 hour  Intake    482 ml  Output      0 ml  Net    482 ml   Blood pressure 154/94, pulse 59, temperature 98.3 F (36.8 C), temperature source Oral, resp. rate 20, height 4\' 11"  (1.499 m), weight 147 lb 6.4 oz (66.86 kg), last menstrual period 04/23/2011, SpO2 97.00%. Temp:  [98.3 F (36.8 C)-98.8 F (37.1 C)] 98.3 F (36.8 C) (11/23 1411) Pulse Rate:  [58-89] 59  (11/23 1411) Resp:  [16-20] 20  (11/23 1411) BP: (125-154)/(73-95) 154/94 mmHg (11/23 1411) SpO2:  [97 %-99 %] 97 % (11/23 1411) Weight:  [147 lb 6.4 oz (66.86 kg)] 147 lb 6.4 oz (66.86 kg) (11/23 1610)  Physical Exam: General: Alert and awake, oriented x3, not in any acute distress. HEENT: anicteric sclera, pupils reactive to light and accommodation, EOMI CVS regular rate, normal r,  no murmur rubs or gallops Chest: clear to auscultation bilaterally, no wheezing, rales or rhonchi Abdomen: slightly distended, pos bowel sounds Skin: no rashes Neuro: nonfocal  Lab Results:  Basename 06/04/11 1623 06/04/11 1015  WBC 2.6* 2.6*  HGB 8.8* 8.1*  HCT 26.7* 24.4*  PLT 213 181   BMET  Basename 06/04/11 1623 06/04/11 1015  NA 143 141  K 3.7 3.5  CL 105 105  CO2 21 21  GLUCOSE 82 82  BUN 23 21  CREATININE 8.92* 8.24*  CALCIUM 10.0 9.7    Micro Results: Recent Results (from the past 240 hour(s))  CULTURE, BLOOD (ROUTINE X 2)     Status: Normal   Collection Time   05/28/11  1:58 PM      Component Value Range Status Comment   Specimen Description BLOOD LEFT FOREARM   Final    Special Requests BOTTLES DRAWN AEROBIC AND ANAEROBIC 4CC EACH   Final    Culture NO GROWTH 5 DAYS   Final    Report Status 06/02/2011 FINAL   Final   CULTURE, BLOOD (ROUTINE X 2)     Status: Normal   Collection Time   05/28/11  2:14 PM    Component Value Range Status Comment   Specimen Description BLOOD RIGHT ARM   Final    Special Requests BOTTLES DRAWN AEROBIC AND ANAEROBIC 6CC   Final    Culture NO GROWTH 5 DAYS   Final    Report Status 06/02/2011 FINAL   Final   MRSA PCR SCREENING     Status: Normal   Collection Time   05/28/11  8:04 PM      Component Value Range Status Comment   MRSA by PCR NEGATIVE  NEGATIVE  Final   CULTURE, BLOOD (SINGLE)     Status: Normal   Collection Time   05/29/11  4:25 PM      Component Value Range Status Comment   Specimen Description BLOOD HEMODIALYSIS CATHETER   Final    Special Requests BOTTLES DRAWN AEROBIC AND ANAEROBIC 5CC   Final    Setup Time [id number]   Final    Culture NO GROWTH 5 DAYS   Final    Report Status 06/04/2011 FINAL   Final   CLOSTRIDIUM DIFFICILE BY PCR     Status: Abnormal   Collection Time   05/30/11 10:32 AM      Component Value Range  Status Comment   C difficile by pcr POSITIVE (*) NEGATIVE  Final   PATHOLOGIST SMEAR REVIEW     Status: Normal   Collection Time   06/01/11  7:00 AM      Component Value Range Status Comment   Tech Review Severe absolute neutropenia   Final   CULTURE, BLOOD (ROUTINE X 2)     Status: Normal (Preliminary result)   Collection Time   06/02/11 11:26 AM      Component Value Range Status Comment   Specimen Description BLOOD CENTRAL LINE   Final    Special Requests BOTTLES DRAWN AEROBIC AND ANAEROBIC 10CC   Final    Setup Time [id number]   Final    Culture     Final    Value:        BLOOD CULTURE RECEIVED NO GROWTH TO DATE CULTURE WILL BE HELD FOR 5 DAYS BEFORE ISSUING A FINAL NEGATIVE REPORT   Report Status PENDING   Incomplete   CULTURE, BLOOD (ROUTINE X 2)     Status: Normal (Preliminary result)   Collection Time   06/02/11 11:28 AM      Component Value Range Status Comment   Specimen Description BLOOD CENTRAL LINE   Final    Special Requests BOTTLES DRAWN AEROBIC AND ANAEROBIC 10CC   Final    Setup Time [id number]    Final    Culture     Final    Value:        BLOOD CULTURE RECEIVED NO GROWTH TO DATE CULTURE WILL BE HELD FOR 5 DAYS BEFORE ISSUING A FINAL NEGATIVE REPORT   Report Status PENDING   Incomplete   CATH TIP CULTURE     Status: Normal (Preliminary result)   Collection Time   06/02/11  4:30 PM      Component Value Range Status Comment   Specimen Description CATH TIP   Final    Special Requests NONE   Final    Culture NO GROWTH 1 DAY   Final    Report Status PENDING   Incomplete     Studies/Results: Ct Abdomen Pelvis Wo Contrast  05/15/2011  **ADDENDUM** CREATED: 05/15/2011 17:18:13  After reviewing the nonenhanced CT along with today's ultrasound, there is likely asymmetric left intrahepatic biliary ductal dilatation.  I would recommend further evaluation with MRCP to assess for obstructing stone or lesion.  **END ADDENDUM** SIGNED BY: Aubery Lapping. Dover, M.D.   05/15/2011  *RADIOLOGY REPORT*  Clinical Data: Back pain, fever.  CT ABDOMEN AND PELVIS WITHOUT CONTRAST  Technique:  Multidetector CT imaging of the abdomen and pelvis was performed following the standard protocol without intravenous contrast.  Comparison: 04/21/2011  Findings: Dependent atelectasis in the lung bases.  Trace pleural effusions.  Heart is upper limits normal in size.  Periportal areas of low density again noted as seen on prior study, possibly periportal edema.  This could also represent intrahepatic biliary ductal dilatation, but it is difficult to determine without IV contrast.  Spleen is upper limits normal in size.  No definite focal lesion.  Pancreatic calcifications are noted compatible with chronic pancreatitis.  There is a small amount of stranding adjacent to the pancreatic tail and extending into the left anterior pararenal space.  There is also stranding in the right anterior pararenal space.  Findings may reflect early or mild changes of acute pancreatitis.  Recommend clinical correlation. Small amount of free fluid is seen  in the pelvis.  Uterus and adnexa are unremarkable.  Large  and small bowel grossly unremarkable.  No acute bony abnormality.  IMPRESSION: Changes of chronic pancreatitis with calcifications present.  There is stranding near the pancreas and extending in the anterior pararenal spaces bilaterally suggesting the possibility of acute pancreatitis.  Question periportal edema versus intrahepatic biliary ductal dilatation.  It is difficult to differentiate without IV contrast.  Bibasilar atelectasis, trace effusions.  Original Report Authenticated By: Cyndie Chime, M.D.   Dg Chest 2 View  05/14/2011  *RADIOLOGY REPORT*  Clinical Data: Fever.  CHEST - 2 VIEW  Comparison: None.  Findings: Left dialysis catheter is in place.  The distal tip was directed laterally into the lateral wall of the upper SVC.  Heart is normal size.  Lungs are clear.  No confluent opacity or effusion.  No acute bony abnormality.  IMPRESSION: No active cardiopulmonary disease.  Original Report Authenticated By: Cyndie Chime, M.D.   Mr Lumbar Spine Wo Contrast  05/15/2011  *RADIOLOGY REPORT*  Clinical Data: Back pain and fever.  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 Evaluate for diskitis or abscess.  MRI LUMBAR SPINE WITHOUT CONTRAST  Technique:  Multiplanar and multiecho pulse sequences of the lumbar spine were obtained without intravenous contrast.  Comparison: Abdominal pelvic CT 05/14/2011.  Findings: CT demonstrates five lumbar type vertebral bodies. Alignment is normal.  There is no evidence of diskitis or osteomyelitis.  There is no evidence of fracture or pars defect.  The conus medullaris extends to the L1-L2 level and appears normal. No focal paraspinal abnormalities are identified.  As demonstrated on recent CT, there is mild retroperitoneal edema.  In addition, there is mild edema within the erector spinae musculature bilaterally.  Both kidneys are grossly abnormal with numerous cysts.  The kidneys are not significantly enlarged.   There is no hydronephrosis.  All of the lumbar discs are well hydrated.  There is a shallow right paracentral disc protrusion at T11-T12.  There are no significant disc space findings from T12-L1 through L3-L4.  L4-L5:  Minimal disc bulge.  There is minimal fluid in the facet joints bilaterally.  There is no subchondral sclerosis or destruction.  There is no foraminal compromise or nerve root encroachment.  L5-S1:  Minimal facet hypertrophy.  No spinal stenosis or nerve root encroachment.  IMPRESSION:  1.  No evidence of lumbar diskitis or osteomyelitis. 2.  No focal paraspinal fluid collections are identified.  There is retroperitoneal edema as well as mild edema in the posterior paraspinal musculature. 3.  Mild facet hypertrophy at L4-L5 and L5-S1. 4.  Multicystic kidneys.  Original Report Authenticated By: Gerrianne Scale, M.D.   US Abdomen Complete  05/15/2011  *RADIOLOGY REPORT*  Clinical Data: Pancreatitis.  Evaluate for gallstones. History of congenital polycystic kidney disease with a stage V chronic kidney disease.  ABDOMEN ULTRASOUND  Technique:  Complete abdominal ultrasound examination was performed including evaluation of the liver, gallbladder, bile ducts, pancreas, kidneys, spleen, IVC, and abdominal aorta.  Comparison: CT scan from 05/14/2011.  Findings:  Gallbladder:  Gallbladder wall is not well distended.  No gallstones are evident, but gallbladder wall thickness is at upper normal measuring 3 mm.  There is no pericholecystic fluid.  The sonographer reports no sonographic Murphy's sign.  Common Bile Duct:  Nondilated at 3 mm diameter.  Liver:  The no focal intraparenchymal abnormality.  Main portal vein is prominent.  No substantial intrahepatic biliary duct dilatation.  IVC:  Normal.  Pancreas:  Pancreatic head and tail are poorly visualized secondary to overlying, obscuring bowel  gas.  Spleen:  Unremarkable  Right kidney:  9.6 cm in long axis.  Diffusely abnormal appearance with cortical  thinning and multiple cystic areas, some of which are relatively well defined and others which are not.  Largest identified cyst measures about 2.4 cm.  Left kidney:  13.7 cm in long axis.  Similar appearance to the contralateral kidney with numerous cystic areas of varying sizes, some of the cystic areas are poorly defined.  Abdominal Aorta:  No aneurysm.  IMPRESSION: Gallbladder is not well distended, but no gallstones are evident. Gallbladder wall thickness is borderline increased without pericholecystic fluid or sonographic Murphy's sign.  Markedly abnormal appearance to the kidneys with cortical thinning and diffuse cystic change bilaterally.  The the patient has a history of congenital renal cystic disease and chronic renal insufficiency.  Original Report Authenticated By: ERIC A. MANSELL, M.D.   Mr Mrcp  05/19/2011  *RADIOLOGY REPORT*  Clinical Data: MRCP, chronic pancreatitis.  MRI ABDOMEN WITHOUT CONTRAST (MRCP)  Technique:  Multiplanar multisequence MR imaging of the abdomen was performed, including heavily T2-weighted images of the biliary and pancreatic ducts.  Three-dimensional MR images were rendered by post processing of the original MR data.  Comparison:  CT 05/14/2011, ultrasound 05/15/2011  Findings:  No pericardial fluid or pleural fluid.  There is near complete replacement of the renal parenchyma by multiple renal cysts consistent with polycystic renal disease.  There is biliary ductal ectasia and small cystic lesions along the left and right intrahepatic biliary ducts.  There are several focal cystic ductal lesions measuring up to 12 mm within the left ductal system.  The ductal ectasia/cystic change is more prominent within the lateral hepatic lobe (image 26, series 2).  There is no obstructing lesion evident at the confluence of the hepatic ducts. The common bile duct and common hepatic duct are normal caliber. The pancreatic duct is not dilated.  There is a thin accessory duct to  communicating with the main pancreatic duct (ductus divisum) .  There are multiple small cystic lesions within the uncinate process of the pancreas, pancreatic body, and pancreatic tail.  There is best seen on coronal image 15 and 19, series 2.  There is no focal hepatic lesion.  The gallbladder is collapsed. The spleen is normal.  Adrenal glands appear normal.  Abdominal aorta is normal.  No skeletal lesion evident.  IMPRESSION:  1.  Complete replacement of the kidneys by innumerable cysts consistent with polycystic kidney disease. 2.  Extensive biliary ductal ectasia more severe within the left hepatic lobe but involving all hepatic ducts is  related to the polycystic kidney disease.  3.  Ductal ectasia and cystic change within the pancreatic head, body and tail also related to polycystic kidney disease. 4.  Ductus divisum variant anatomy.  No evidence of acute pancreatitis.  Original Report Authenticated By: Genevive Bi, M.D.   Mr 3d Recon At Scanner  05/19/2011  *RADIOLOGY REPORT*  Clinical Data: MRCP, chronic pancreatitis.  MRI ABDOMEN WITHOUT CONTRAST (MRCP)  Technique:  Multiplanar multisequence MR imaging of the abdomen was performed, including heavily T2-weighted images of the biliary and pancreatic ducts.  Three-dimensional MR images were rendered by post processing of the original MR data.  Comparison:  CT 05/14/2011, ultrasound 05/15/2011  Findings:  No pericardial fluid or pleural fluid.  There is near complete replacement of the renal parenchyma by multiple renal cysts consistent with polycystic renal disease.  There is biliary ductal ectasia and small cystic lesions along the left and right  intrahepatic biliary ducts.  There are several focal cystic ductal lesions measuring up to 12 mm within the left ductal system.  The ductal ectasia/cystic change is more prominent within the lateral hepatic lobe (image 26, series 2).  There is no obstructing lesion evident at the confluence of the hepatic  ducts. The common bile duct and common hepatic duct are normal caliber. The pancreatic duct is not dilated.  There is a thin accessory duct to communicating with the main pancreatic duct (ductus divisum) .  There are multiple small cystic lesions within the uncinate process of the pancreas, pancreatic body, and pancreatic tail.  There is best seen on coronal image 15 and 19, series 2.  There is no focal hepatic lesion.  The gallbladder is collapsed. The spleen is normal.  Adrenal glands appear normal.  Abdominal aorta is normal.  No skeletal lesion evident.  IMPRESSION:  1.  Complete replacement of the kidneys by innumerable cysts consistent with polycystic kidney disease. 2.  Extensive biliary ductal ectasia more severe within the left hepatic lobe but involving all hepatic ducts is  related to the polycystic kidney disease.  3.  Ductal ectasia and cystic change within the pancreatic head, body and tail also related to polycystic kidney disease. 4.  Ductus divisum variant anatomy.  No evidence of acute pancreatitis.  Original Report Authenticated By: Genevive Bi, M.D.   Dg Chest Port 1 View  06/02/2011  *RADIOLOGY REPORT*  Clinical Data: Pneumonia  PORTABLE CHEST - 1 VIEW  Comparison: 05/29/2011  Findings: Early airspace disease at the left base obscures the left hemidiaphragm.  Right lung is clear.  Normal heart size.  No pneumothorax.  Stable malpositioned left subclavian dialysis catheter.  IMPRESSION: New early airspace disease at the left base.  Original Report Authenticated By: Donavan Burnet, M.D.   Portable Chest Xray In Am  05/29/2011  *RADIOLOGY REPORT*  Clinical Data: Shortness of breath.  Neutropenic fever.  PORTABLE CHEST - 1 VIEW  Comparison: 05/28/2011  Findings: Stable appearance of the dialysis catheter noted with the distal tip projecting over the right mediastinal margin with a transverse orientation.  Borderline cardiomegaly noted.  There is linear subsegmental atelectasis in the  lingula, but otherwise the lungs appear clear.  IMPRESSION:  1.  Stable distal orientation of the dialysis catheter. 2.  Lingular subsegmental atelectasis. 3.  Borderline cardiomegaly.  Original Report Authenticated By: Dellia Cloud, M.D.   Dg Chest Portable 1 View  05/28/2011  *RADIOLOGY REPORT*  Clinical Data: Sepsis, fever  PORTABLE CHEST - 1 VIEW  Comparison: 05/14/2011  Findings: Lungs are clear. No pleural effusion or pneumothorax.  The heart is top normal in size.  Stable left subclavian dual lumen dialysis catheter.  IMPRESSION: No evidence of acute cardiopulmonary disease.  Original Report Authenticated By: Charline Bills, M.D.    Antibiotics:  Anti-infectives     Start     Dose/Rate Route Frequency Ordered Stop   06/04/11 1200   vancomycin (VANCOCIN) 750 mg in sodium chloride 0.9 % 150 mL IVPB        750 mg 150 mL/hr over 60 Minutes Intravenous Every M-W-F (Hemodialysis) 06/02/11 1241     06/02/11 1800   vancomycin (VANCOCIN) 50 mg/mL oral solution 125 mg        125 mg Oral 4 times daily 06/02/11 1330     06/02/11 1500   vancomycin (VANCOCIN) 1,500 mg in sodium chloride 0.9 % 500 mL IVPB        1,500 mg  250 mL/hr over 120 Minutes Intravenous  Once 06/02/11 1241 06/02/11 1638   06/02/11 1400   metroNIDAZOLE (FLAGYL) tablet 500 mg  Status:  Discontinued        500 mg Oral 3 times per day 06/02/11 1112 06/02/11 1331   06/02/11 1200   ceFEPIme (MAXIPIME) 2 g in dextrose 5 % 50 mL IVPB        2 g 100 mL/hr over 30 Minutes Intravenous Every M-W-F (Hemodialysis) 06/01/11 1117     06/01/11 1400   ciprofloxacin (CIPRO) IVPB 400 mg  Status:  Discontinued        400 mg 200 mL/hr over 60 Minutes Intravenous Every 24 hours 06/01/11 1133 06/01/11 1258   06/01/11 1200   ceFEPIme (MAXIPIME) 1 g in dextrose 5 % 50 mL IVPB        1 g 100 mL/hr over 30 Minutes Intravenous NOW 06/01/11 1117 06/01/11 1314   05/31/11 1600   metroNIDAZOLE (FLAGYL) IVPB 500 mg  Status:  Discontinued         500 mg 100 mL/hr over 60 Minutes Intravenous Every 8 hours 05/31/11 1505 06/02/11 1112   05/31/11 1200   tobramycin (NEBCIN) 132 mg in dextrose 5 % 50 mL IVPB  Status:  Discontinued        132 mg 106.6 mL/hr over 30 Minutes Intravenous Every M-W-F (Hemodialysis) 05/29/11 0408 06/01/11 1115   05/30/11 1400   metroNIDAZOLE (FLAGYL) tablet 500 mg  Status:  Discontinued        500 mg Oral 3 times per day 05/30/11 1339 05/31/11 1505   05/29/11 0600   piperacillin-tazobactam (ZOSYN) IVPB 2.25 g  Status:  Discontinued        2.25 g 100 mL/hr over 30 Minutes Intravenous 3 times per day 05/29/11 0420 06/01/11 1115   05/29/11 0415   tobramycin (NEBCIN) 172 mg in dextrose 5 % 50 mL IVPB        172 mg 108.6 mL/hr over 30 Minutes Intravenous  Once 05/29/11 0408 05/29/11 0728   05/28/11 1400   vancomycin (VANCOCIN) IVPB 1000 mg/200 mL premix        1,000 mg 200 mL/hr over 60 Minutes Intravenous  Once 05/28/11 1356 05/28/11 1634   05/28/11 1400  piperacillin-tazobactam (ZOSYN) IVPB 3.375 g       3.375 g 12.5 mL/hr over 240 Minutes Intravenous  Once 05/28/11 1356 05/28/11 1833          Medications: Scheduled Meds:    . ceFEPime (MAXIPIME) IV  2 g Intravenous Q M,W,F-HD  . darbepoetin (ARANESP) injection - DIALYSIS  150 mcg Intravenous Q Wed-HD  . heparin  5,000 Units Subcutaneous Q8H  . lipase/protease/amylase  2 capsule Oral TID WC  . metoCLOPramide (REGLAN) injection  5 mg Intravenous Q8H  . vancomycin  125 mg Oral QID  . vancomycin  750 mg Intravenous Q M,W,F-HD   Continuous Infusions:  PRN Meds:.sodium chloride, sodium chloride, acetaminophen, albuterol, diphenhydrAMINE, feeding supplement (NEPRO CARB STEADY), heparin, heparin, heparin, lidocaine, lidocaine-prilocaine, morphine injection, ondansetron (ZOFRAN) IV, ondansetron, pentafluoroprop-tetrafluoroeth, promethazine  Assessment/Plan: Monica Guerra is a 25 y.o. female with   congenital cranial facial abnormality and  recently diagnosed with ESRD 2/2 polycystic kidney disease. She has recently started hemodialysis in the last 5 weeks, however, has had difficulty with maintaining an HD line due to occlusion, sShe was transferred from [facility] on 05/28/2011 after being found to have GNR bacteremia, later identified as enterobacter cloacae, likely from a left subclavian central catheter  used for hemodialysis. She had fever (tmax 103 F), chills, myalgia and non-productive cough. She was started on empiric antibiotics and WBC was >10K. Antibiotics included Zosyn,Tobra (with HD),and Vanco. She has been subsquently found to have C difficile colitis and also neutropenia. Her HD line was removed on the 21st. She was simplified to cefepim for her enterobacter infection and oral vancomycin for her C difficile colitis. She was started on IV vancomyinc due to concer for opacities in lungs on portable film and followup continues to show infiltrate   Enterobacter Bacteremia: Per Dr. Lowell Guitar Enterobacter was Ancef R otherwise sensitive (labs at [facility]) --will change to Ceftazidime, more narrow and can be given with HD -- I would like to make sure she gets two weeks of IV ceftaz beyond removal of her HD catheter and with clean followup cultures  C difficile colitis: --continue oral vancomycin, would continue for 2 weeks AFTER she finsihes therapy for her enterobacter bacteremia  ?HCAP: --Continue ceftaz and vancomycin that was added. Would keep vancomycin going for 7 days total and then dc    .  LOS: 7 days   Acey Lav 06/04/2011, 5:31 PM

## 2011-06-04 NOTE — Anesthesia Postprocedure Evaluation (Signed)
  Anesthesia Post-op Note  Patient: Monica Guerra  Procedure(s) Performed:  INSERTION OF DIALYSIS CATHETER - 28cm dialysis catheter placed in Right Internal Jugular  Patient Location: PACU  Anesthesia Type: General  Level of Consciousness: awake  Airway and Oxygen Therapy: Patient Spontanous Breathing  Post-op Pain: none  Post-op Assessment: Post-op Vital signs reviewed  Post-op Vital Signs: stable  Complications: No apparent anesthesia complications

## 2011-06-04 NOTE — Progress Notes (Signed)
ANTIBIOTIC CONSULT NOTE - FOLLOW UP  Pharmacy Consult for Ceftazidime / Vancomycin Indication: Enterobacter bacteremia/Questionable PNA  Allergies  Allergen Reactions  . Vancomycin Itching    Patient usually takes Benadryl Before.     Patient Measurements: Height: 4\' 11"  (149.9 cm) Weight: 147 lb 6.4 oz (66.86 kg) (scale B) IBW/kg (Calculated) : 43.2  Adjusted Body Weight:   Vital Signs: Temp: 98.3 F (36.8 C) (11/23 1411) Temp src: Oral (11/23 1411) BP: 154/94 mmHg (11/23 1411) Pulse Rate: 59  (11/23 1411) Intake/Output from previous day: 11/22 0701 - 11/23 0700 In: 482 [P.O.:480; IV Piggyback:2] Out: -  Intake/Output from this shift:    Labs:  Basename 06/04/11 1623 06/04/11 1015 06/03/11 0630 06/02/11 0849  WBC 2.6* 2.6* 2.1* --  HGB 8.8* 8.1* 7.7* --  PLT 213 181 149* --  LABCREA -- -- -- --  CREATININE 8.92* 8.24* -- 7.83*   Estimated Creatinine Clearance: 8.2 ml/min (by C-G formula based on Cr of 8.92). No results found for this basename: VANCOTROUGH:2,VANCOPEAK:2,VANCORANDOM:2,GENTTROUGH:2,GENTPEAK:2,GENTRANDOM:2,TOBRATROUGH:2,TOBRAPEAK:2,TOBRARND:2,AMIKACINPEAK:2,AMIKACINTROU:2,AMIKACIN:2, in the last 72 hours   Microbiology: Recent Results (from the past 720 hour(s))  CULTURE, BLOOD (ROUTINE X 2)     Status: Normal   Collection Time   05/14/11  6:00 PM      Component Value Range Status Comment   Specimen Description BLOOD LEFT ARM   Final    Special Requests BOTTLES DRAWN AEROBIC AND ANAEROBIC 6CC   Final    Culture NO GROWTH 5 DAYS   Final    Report Status 05/19/2011 FINAL   Final   CULTURE, BLOOD (ROUTINE X 2)     Status: Normal   Collection Time   05/14/11  6:30 PM      Component Value Range Status Comment   Specimen Description BLOOD RIGHT ARM   Final    Special Requests BOTTLES DRAWN AEROBIC AND ANAEROBIC 6CC   Final    Culture NO GROWTH 5 DAYS   Final    Report Status 05/19/2011 FINAL   Final   URINE CULTURE     Status: Normal   Collection  Time   05/14/11  6:43 PM      Component Value Range Status Comment   Specimen Description URINE, CLEAN CATCH   Final    Special Requests NONE   Final    Setup Time [id number]   Final    Colony Count NO GROWTH   Final    Culture NO GROWTH   Final    Report Status 05/15/2011 FINAL   Final   URINE CULTURE     Status: Normal   Collection Time   05/15/11  1:04 AM      Component Value Range Status Comment   Specimen Description URINE, CLEAN CATCH   Final    Special Requests NONE   Final    Setup Time [id number]   Final    Colony Count NO GROWTH   Final    Culture NO GROWTH   Final    Report Status 05/16/2011 FINAL   Final   MRSA PCR SCREENING     Status: Normal   Collection Time   05/15/11  1:12 AM      Component Value Range Status Comment   MRSA by PCR NEGATIVE  NEGATIVE  Final   CLOSTRIDIUM DIFFICILE BY PCR     Status: Normal   Collection Time   05/15/11  1:15 AM      Component Value Range Status Comment   C difficile by pcr  NEGATIVE  NEGATIVE  Final   CULTURE, BLOOD (ROUTINE X 2)     Status: Normal   Collection Time   05/28/11  1:58 PM      Component Value Range Status Comment   Specimen Description BLOOD LEFT FOREARM   Final    Special Requests BOTTLES DRAWN AEROBIC AND ANAEROBIC 4CC EACH   Final    Culture NO GROWTH 5 DAYS   Final    Report Status 06/02/2011 FINAL   Final   CULTURE, BLOOD (ROUTINE X 2)     Status: Normal   Collection Time   05/28/11  2:14 PM      Component Value Range Status Comment   Specimen Description BLOOD RIGHT ARM   Final    Special Requests BOTTLES DRAWN AEROBIC AND ANAEROBIC 6CC   Final    Culture NO GROWTH 5 DAYS   Final    Report Status 06/02/2011 FINAL   Final   MRSA PCR SCREENING     Status: Normal   Collection Time   05/28/11  8:04 PM      Component Value Range Status Comment   MRSA by PCR NEGATIVE  NEGATIVE  Final   CULTURE, BLOOD (SINGLE)     Status: Normal   Collection Time   05/29/11  4:25 PM      Component Value Range Status  Comment   Specimen Description BLOOD HEMODIALYSIS CATHETER   Final    Special Requests BOTTLES DRAWN AEROBIC AND ANAEROBIC 5CC   Final    Setup Time [id number]   Final    Culture NO GROWTH 5 DAYS   Final    Report Status 06/04/2011 FINAL   Final   CLOSTRIDIUM DIFFICILE BY PCR     Status: Abnormal   Collection Time   05/30/11 10:32 AM      Component Value Range Status Comment   C difficile by pcr POSITIVE (*) NEGATIVE  Final   PATHOLOGIST SMEAR REVIEW     Status: Normal   Collection Time   06/01/11  7:00 AM      Component Value Range Status Comment   Tech Review Severe absolute neutropenia   Final   CULTURE, BLOOD (ROUTINE X 2)     Status: Normal (Preliminary result)   Collection Time   06/02/11 11:26 AM      Component Value Range Status Comment   Specimen Description BLOOD CENTRAL LINE   Final    Special Requests BOTTLES DRAWN AEROBIC AND ANAEROBIC 10CC   Final    Setup Time [id number]   Final    Culture     Final    Value:        BLOOD CULTURE RECEIVED NO GROWTH TO DATE CULTURE WILL BE HELD FOR 5 DAYS BEFORE ISSUING A FINAL NEGATIVE REPORT   Report Status PENDING   Incomplete   CULTURE, BLOOD (ROUTINE X 2)     Status: Normal (Preliminary result)   Collection Time   06/02/11 11:28 AM      Component Value Range Status Comment   Specimen Description BLOOD CENTRAL LINE   Final    Special Requests BOTTLES DRAWN AEROBIC AND ANAEROBIC 10CC   Final    Setup Time [id number]   Final    Culture     Final    Value:        BLOOD CULTURE RECEIVED NO GROWTH TO DATE CULTURE WILL BE HELD FOR 5 DAYS BEFORE ISSUING A FINAL NEGATIVE REPORT   Report Status PENDING  Incomplete   CATH TIP CULTURE     Status: Normal (Preliminary result)   Collection Time   06/02/11  4:30 PM      Component Value Range Status Comment   Specimen Description CATH TIP   Final    Special Requests NONE   Final    Culture NO GROWTH 1 DAY   Final    Report Status PENDING   Incomplete     Anti-infectives      Start     Dose/Rate Route Frequency Ordered Stop   06/04/11 1200   vancomycin (VANCOCIN) 750 mg in sodium chloride 0.9 % 150 mL IVPB        750 mg 150 mL/hr over 60 Minutes Intravenous Every M-W-F (Hemodialysis) 06/02/11 1241     06/02/11 1800   vancomycin (VANCOCIN) 50 mg/mL oral solution 125 mg        125 mg Oral 4 times daily 06/02/11 1330     06/02/11 1500   vancomycin (VANCOCIN) 1,500 mg in sodium chloride 0.9 % 500 mL IVPB        1,500 mg 250 mL/hr over 120 Minutes Intravenous  Once 06/02/11 1241 06/02/11 1638   06/02/11 1400   metroNIDAZOLE (FLAGYL) tablet 500 mg  Status:  Discontinued        500 mg Oral 3 times per day 06/02/11 1112 06/02/11 1331   06/02/11 1200   ceFEPIme (MAXIPIME) 2 g in dextrose 5 % 50 mL IVPB  Status:  Discontinued        2 g 100 mL/hr over 30 Minutes Intravenous Every M-W-F (Hemodialysis) 06/01/11 1117 06/04/11 1734   06/01/11 1400   ciprofloxacin (CIPRO) IVPB 400 mg  Status:  Discontinued        400 mg 200 mL/hr over 60 Minutes Intravenous Every 24 hours 06/01/11 1133 06/01/11 1258   06/01/11 1200   ceFEPIme (MAXIPIME) 1 g in dextrose 5 % 50 mL IVPB        1 g 100 mL/hr over 30 Minutes Intravenous NOW 06/01/11 1117 06/01/11 1314   05/31/11 1600   metroNIDAZOLE (FLAGYL) IVPB 500 mg  Status:  Discontinued        500 mg 100 mL/hr over 60 Minutes Intravenous Every 8 hours 05/31/11 1505 06/02/11 1112   05/31/11 1200   tobramycin (NEBCIN) 132 mg in dextrose 5 % 50 mL IVPB  Status:  Discontinued        132 mg 106.6 mL/hr over 30 Minutes Intravenous Every M-W-F (Hemodialysis) 05/29/11 0408 06/01/11 1115   05/30/11 1400   metroNIDAZOLE (FLAGYL) tablet 500 mg  Status:  Discontinued        500 mg Oral 3 times per day 05/30/11 1339 05/31/11 1505   05/29/11 0600   piperacillin-tazobactam (ZOSYN) IVPB 2.25 g  Status:  Discontinued        2.25 g 100 mL/hr over 30 Minutes Intravenous 3 times per day 05/29/11 0420 06/01/11 1115   05/29/11 0415   tobramycin  (NEBCIN) 172 mg in dextrose 5 % 50 mL IVPB        172 mg 108.6 mL/hr over 30 Minutes Intravenous  Once 05/29/11 0408 05/29/11 0728   05/28/11 1400   vancomycin (VANCOCIN) IVPB 1000 mg/200 mL premix        1,000 mg 200 mL/hr over 60 Minutes Intravenous  Once 05/28/11 1356 05/28/11 1634   05/28/11 1400   piperacillin-tazobactam (ZOSYN) IVPB 3.375 g        3.375 g 12.5 mL/hr over 240  Minutes Intravenous  Once 05/28/11 1356 05/28/11 1833          Assessment/Plan 1.Enterobacter bacteremia - Cefepime d/c, and ceftazidime to be started. Ceftazidime 2gm IV x 1, and 2gm IV QHD.  2.HCAP- D3 vancomycin 750mg  QHD. Continue current dosing and plan on trough level next week  4.C. difficle colitis - continue PO vancomycin. Per ID note, plan to continue for 2 weeks after completion of antibiotics for enterobacter bacteremia.   Monica Guerra 06/04/2011,5:47 PM

## 2011-06-05 ENCOUNTER — Inpatient Hospital Stay (HOSPITAL_COMMUNITY): Payer: PRIVATE HEALTH INSURANCE

## 2011-06-05 LAB — DIFFERENTIAL
Basophils Absolute: 0.1 10*3/uL (ref 0.0–0.1)
Eosinophils Absolute: 0.1 10*3/uL (ref 0.0–0.7)
Lymphocytes Relative: 48 % — ABNORMAL HIGH (ref 12–46)
Monocytes Relative: 29 % — ABNORMAL HIGH (ref 3–12)
Neutro Abs: 0.5 10*3/uL — ABNORMAL LOW (ref 1.7–7.7)
Neutrophils Relative %: 17 % — ABNORMAL LOW (ref 43–77)

## 2011-06-05 LAB — CBC
HCT: 26.1 % — ABNORMAL LOW (ref 36.0–46.0)
MCHC: 32.6 g/dL (ref 30.0–36.0)
Platelets: 246 10*3/uL (ref 150–400)
RDW: 15.1 % (ref 11.5–15.5)
WBC: 3 10*3/uL — ABNORMAL LOW (ref 4.0–10.5)

## 2011-06-05 LAB — CATH TIP CULTURE

## 2011-06-05 MED ORDER — OXYCODONE-ACETAMINOPHEN 5-325 MG PO TABS
ORAL_TABLET | ORAL | Status: AC
  Administered 2011-06-05: 2 via ORAL
  Filled 2011-06-05: qty 2

## 2011-06-05 MED ORDER — SODIUM CHLORIDE 0.9 % IV SOLN: INTRAVENOUS | Status: AC

## 2011-06-05 MED ORDER — MORPHINE SULFATE 2 MG/ML IJ SOLN
INTRAMUSCULAR | Status: AC
  Administered 2011-06-05: 2 mg via INTRAVENOUS
  Filled 2011-06-05: qty 1

## 2011-06-05 MED ORDER — ACETAMINOPHEN 325 MG PO TABS
ORAL_TABLET | ORAL | Status: AC
  Administered 2011-06-05: 650 mg via ORAL
  Filled 2011-06-05: qty 2

## 2011-06-05 MED ORDER — DIPHENHYDRAMINE HCL 25 MG PO CAPS
ORAL_CAPSULE | ORAL | Status: AC
  Administered 2011-06-05: 25 mg via ORAL
  Filled 2011-06-05: qty 1

## 2011-06-05 NOTE — Progress Notes (Signed)
Subjective: It is post placement of right IJ dialysis catheter  Objective: Vital signs in last 24 hours: Filed Vitals:   06/05/11 0921 06/05/11 0930 06/05/11 1000 06/05/11 1030  BP: 131/90 129/84 133/87   Pulse:  77 103 95  Temp:      TempSrc:      Resp: 19 22 21 17   Height:      Weight:      SpO2: 96% 96% 97% 97%    Intake/Output Summary (Last 24 hours) at 06/05/11 1032 Last data filed at 06/04/11 2137  Gross per 24 hour  Intake    150 ml  Output      0 ml  Net    150 ml    Weight change: -3.16 kg (-6 lb 15.5 oz)  General: Alert, awake, oriented x3, in no acute distress. HEENT: No bruits, no goiter. Heart: Regular rate and rhythm, without murmurs, rubs, gallops. Lungs: Clear to auscultation bilaterally. Abdomen: Soft, nontender, nondistended, positive bowel sounds. Extremities: No clubbing cyanosis or edema with positive pedal pulses. Neuro: Grossly intact, nonfocal.   Lab Results: Results for orders placed during the hospital encounter of 05/28/11 (from the past 24 hour(s))  CBC     Status: Abnormal   Collection Time   06/04/11  4:23 PM      Component Value Range   WBC 2.6 (*) 4.0 - 10.5 (K/uL)   RBC 2.98 (*) 3.87 - 5.11 (MIL/uL)   Hemoglobin 8.8 (*) 12.0 - 15.0 (g/dL)   HCT 16.1 (*) 09.6 - 46.0 (%)   MCV 89.6  78.0 - 100.0 (fL)   MCH 29.5  26.0 - 34.0 (pg)   MCHC 33.0  30.0 - 36.0 (g/dL)   RDW 04.5  40.9 - 81.1 (%)   Platelets 213  150 - 400 (K/uL)  BASIC METABOLIC PANEL     Status: Abnormal   Collection Time   06/04/11  4:23 PM      Component Value Range   Sodium 143  135 - 145 (mEq/L)   Potassium 3.7  3.5 - 5.1 (mEq/L)   Chloride 105  96 - 112 (mEq/L)   CO2 21  19 - 32 (mEq/L)   Glucose, Bld 82  70 - 99 (mg/dL)   BUN 23  6 - 23 (mg/dL)   Creatinine, Ser 9.14 (*) 0.50 - 1.10 (mg/dL)   Calcium 78.2  8.4 - 10.5 (mg/dL)   GFR calc non Af Amer 6 (*) >90 (mL/min)   GFR calc Af Amer 6 (*) >90 (mL/min)  DIFFERENTIAL     Status: Abnormal   Collection Time    06/04/11  4:23 PM      Component Value Range   Neutrophils Relative 18 (*) 43 - 77 (%)   Lymphocytes Relative 62 (*) 12 - 46 (%)   Monocytes Relative 16 (*) 3 - 12 (%)   Eosinophils Relative 4  0 - 5 (%)   Basophils Relative 0  0 - 1 (%)   Band Neutrophils 0  0 - 10 (%)   Metamyelocytes Relative 0     Myelocytes 0     Promyelocytes Absolute 0     Blasts 0     nRBC 2 (*) 0 (/100 WBC)   Neutro Abs 0.5 (*) 1.7 - 7.7 (K/uL)   Lymphs Abs 1.6  0.7 - 4.0 (K/uL)   Monocytes Absolute 0.4  0.1 - 1.0 (K/uL)   Eosinophils Absolute 0.1  0.0 - 0.7 (K/uL)   Basophils Absolute 0.0  0.0 - 0.1 (K/uL)   RBC Morphology RARE NRBCs     WBC Morphology MILD LEFT SHIFT (1-5% METAS, OCC MYELO, OCC BANDS)    CBC     Status: Abnormal   Collection Time   06/05/11  5:30 AM      Component Value Range   WBC 3.0 (*) 4.0 - 10.5 (K/uL)   RBC 2.88 (*) 3.87 - 5.11 (MIL/uL)   Hemoglobin 8.5 (*) 12.0 - 15.0 (g/dL)   HCT 40.9 (*) 81.1 - 46.0 (%)   MCV 90.6  78.0 - 100.0 (fL)   MCH 29.5  26.0 - 34.0 (pg)   MCHC 32.6  30.0 - 36.0 (g/dL)   RDW 91.4  78.2 - 95.6 (%)   Platelets 246  150 - 400 (K/uL)  DIFFERENTIAL     Status: Abnormal   Collection Time   06/05/11  5:30 AM      Component Value Range   Neutrophils Relative 17 (*) 43 - 77 (%)   Lymphocytes Relative 48 (*) 12 - 46 (%)   Monocytes Relative 29 (*) 3 - 12 (%)   Eosinophils Relative 2  0 - 5 (%)   Basophils Relative 4 (*) 0 - 1 (%)   Neutro Abs 0.5 (*) 1.7 - 7.7 (K/uL)   Lymphs Abs 1.4  0.7 - 4.0 (K/uL)   Monocytes Absolute 0.9  0.1 - 1.0 (K/uL)   Eosinophils Absolute 0.1  0.0 - 0.7 (K/uL)   Basophils Absolute 0.1  0.0 - 0.1 (K/uL)   WBC Morphology MILD LEFT SHIFT (1-5% METAS, OCC MYELO, OCC BANDS)     Smear Review LARGE PLATELETS PRESENT      Micro: Recent Results (from the past 240 hour(s))  CULTURE, BLOOD (ROUTINE X 2)     Status: Normal   Collection Time   05/28/11  1:58 PM      Component Value Range Status Comment   Specimen Description  BLOOD LEFT FOREARM   Final    Special Requests BOTTLES DRAWN AEROBIC AND ANAEROBIC 4CC EACH   Final    Culture NO GROWTH 5 DAYS   Final    Report Status 06/02/2011 FINAL   Final   CULTURE, BLOOD (ROUTINE X 2)     Status: Normal   Collection Time   05/28/11  2:14 PM      Component Value Range Status Comment   Specimen Description BLOOD RIGHT ARM   Final    Special Requests BOTTLES DRAWN AEROBIC AND ANAEROBIC 6CC   Final    Culture NO GROWTH 5 DAYS   Final    Report Status 06/02/2011 FINAL   Final   MRSA PCR SCREENING     Status: Normal   Collection Time   05/28/11  8:04 PM      Component Value Range Status Comment   MRSA by PCR NEGATIVE  NEGATIVE  Final   CULTURE, BLOOD (SINGLE)     Status: Normal   Collection Time   05/29/11  4:25 PM      Component Value Range Status Comment   Specimen Description BLOOD HEMODIALYSIS CATHETER   Final    Special Requests BOTTLES DRAWN AEROBIC AND ANAEROBIC 5CC   Final    Setup Time [id number]   Final    Culture NO GROWTH 5 DAYS   Final    Report Status 06/04/2011 FINAL   Final   CLOSTRIDIUM DIFFICILE BY PCR     Status: Abnormal   Collection Time   05/30/11 10:32 AM  Component Value Range Status Comment   C difficile by pcr POSITIVE (*) NEGATIVE  Final   PATHOLOGIST SMEAR REVIEW     Status: Normal   Collection Time   06/01/11  7:00 AM      Component Value Range Status Comment   Tech Review Severe absolute neutropenia   Final   CULTURE, BLOOD (ROUTINE X 2)     Status: Normal (Preliminary result)   Collection Time   06/02/11 11:26 AM      Component Value Range Status Comment   Specimen Description BLOOD CENTRAL LINE   Final    Special Requests BOTTLES DRAWN AEROBIC AND ANAEROBIC 10CC   Final    Setup Time [id number]   Final    Culture     Final    Value:        BLOOD CULTURE RECEIVED NO GROWTH TO DATE CULTURE WILL BE HELD FOR 5 DAYS BEFORE ISSUING A FINAL NEGATIVE REPORT   Report Status PENDING   Incomplete   CULTURE, BLOOD (ROUTINE X  2)     Status: Normal (Preliminary result)   Collection Time   06/02/11 11:28 AM      Component Value Range Status Comment   Specimen Description BLOOD CENTRAL LINE   Final    Special Requests BOTTLES DRAWN AEROBIC AND ANAEROBIC 10CC   Final    Setup Time [id number]   Final    Culture     Final    Value:        BLOOD CULTURE RECEIVED NO GROWTH TO DATE CULTURE WILL BE HELD FOR 5 DAYS BEFORE ISSUING A FINAL NEGATIVE REPORT   Report Status PENDING   Incomplete   CATH TIP CULTURE     Status: Normal   Collection Time   06/02/11  4:30 PM      Component Value Range Status Comment   Specimen Description CATH TIP   Final    Special Requests NONE   Final    Culture NO GROWTH 2 DAYS   Final    Report Status 06/05/2011 FINAL   Final     Studies/Results: Dg Chest 1 View  06/03/2011  *RADIOLOGY REPORT*  Clinical Data: Cough.  CHEST - 1 VIEW  Comparison: Plain films of the chest 06/02/2011.  Findings: Dialysis catheter has been removed.  Again seen is left lower lobe airspace disease consistent with pneumonia.  Right lung is clear.  Heart size normal.  No pneumothorax or pleural effusion.  IMPRESSION: Left lower lobe pneumonia.  Original Report Authenticated By: Bernadene Bell. Maricela Curet, M.D.   Dg Chest Port 1 View  06/04/2011  *RADIOLOGY REPORT*  Clinical Data: 25 year old female - status post Diatek catheter insertion.  PORTABLE CHEST - 1 VIEW  Comparison: 06/03/2011  Findings: The cardiomediastinal silhouette is unremarkable. A right IJ central venous catheter is identified with tips overlying the lower SVC and cavoatrial junction. Left lower lobe opacity is again identified. There is no evidence of pulmonary edema, pulmonary nodule/mass, pleural effusion, or pneumothorax. No acute bony abnormalities are identified.  IMPRESSION: Right central venous catheter placement - no evidence of pneumothorax.  Continued left lower lobe opacity - question airspace disease verses atelectasis.  Original Report  Authenticated By: Rosendo Gros, M.D.   Dg Fluoro Guide Cv Line-no Report  06/04/2011  CLINICAL DATA: Dialysis Catheter placement   FLOURO GUIDE CV LINE  Fluoroscopy was utilized by the requesting physician.  No radiographic  interpretation.      Medications:     .  cefTAZidime (FORTAZ) IV  2 g Intravenous Once  . cefTAZidime (FORTAZ) IV  2 g Intravenous Q T,Th,Sa-HD  . cefTAZidime (FORTAZ) IV  2 g Intravenous Q M,W,F-HD  . darbepoetin (ARANESP) injection - DIALYSIS  150 mcg Intravenous Q Wed-HD  . heparin  5,000 Units Subcutaneous Q8H  . lipase/protease/amylase  2 capsule Oral TID WC  . metoCLOPramide (REGLAN) injection  5 mg Intravenous Q8H  . saccharomyces boulardii  250 mg Oral BID  . vancomycin  125 mg Oral QID  . vancomycin  750 mg Intravenous Q M,W,F-HD  . DISCONTD: ceFEPime (MAXIPIME) IV  2 g Intravenous Q M,W,F-HD     Assessment:  1) ESRD- new to dialysis 5 weeks ago.Will continue to dialyze MWF in the hospital. Eventual Plan is for Peritoneal Dialysis. Hemodialysis catheter removed and replaced.   2) fever/Enterobacter cloacea Sepsis- currently on Ceftazidime, to be given with hemodialysis  X-ray suggestive of left lower lobe infiltrate.Dr Daiva Eves recommends to continue with two weeks of IV ceftaz beyond removal of her HD catheter and with clean followup cultures   3.) C.Difficile ID recommends vancomycin 125 mg 4 times a day for    2 weeks AFTER she finsihes therapy for her enterobacter bacteremia   4) Leukopenia neutropenia, ANC improving,-prob mutlifactorial, per hematology  5) HTN - amlodipine discontinued, as blood pressure low last night.  6) Anemia -begin ESA Rx (aranep--done) ,[name], MD, consulted, at this time there is no indication to start Neupogen, conservator be resolving spontaneously, check daily CBC with differential,   #7 nausea likely secondary to medications for such as by mouth vancomycin, will start her on Reglan scheduled to see this  helps.   #8 healthcare associated pneumonia   Continue ceftaz and vancomycin that was added. Would keep vancomycin going for 7 days total and then dc   Likely DC home Monday or Tuesday    LOS: 8 days   [facility] 06/05/2011, 10:32 AM

## 2011-06-05 NOTE — Progress Notes (Signed)
On hemodialysis, using new catheter with diminished BFR.  T99.4, goal 3500, cath in Right IJ.  Monica Guerra C

## 2011-06-06 ENCOUNTER — Inpatient Hospital Stay (HOSPITAL_COMMUNITY): Payer: PRIVATE HEALTH INSURANCE

## 2011-06-06 LAB — DIFFERENTIAL
Basophils Absolute: 0 10*3/uL (ref 0.0–0.1)
Basophils Relative: 0 % (ref 0–1)
Eosinophils Absolute: 0 10*3/uL (ref 0.0–0.7)
Eosinophils Relative: 0 % (ref 0–5)
Lymphocytes Relative: 51 % — ABNORMAL HIGH (ref 12–46)
Lymphs Abs: 2.1 10*3/uL (ref 0.7–4.0)
Monocytes Relative: 6 % (ref 3–12)
Myelocytes: 3 %
Neutro Abs: 1.9 10*3/uL (ref 1.7–7.7)
Neutrophils Relative %: 38 % — ABNORMAL LOW (ref 43–77)
nRBC: 0 /100 WBC

## 2011-06-06 LAB — CBC
Hemoglobin: 9.2 g/dL — ABNORMAL LOW (ref 12.0–15.0)
MCH: 29.2 pg (ref 26.0–34.0)
Platelets: 270 10*3/uL (ref 150–400)
RBC: 3.15 MIL/uL — ABNORMAL LOW (ref 3.87–5.11)
WBC: 4.3 10*3/uL (ref 4.0–10.5)

## 2011-06-06 MED ORDER — HEPARIN SODIUM (PORCINE) 1000 UNIT/ML DIALYSIS
20.0000 [IU]/kg | INTRAMUSCULAR | Status: DC | PRN
  Administered 2011-06-07: 1300 [IU] via INTRAVENOUS_CENTRAL
  Filled 2011-06-06: qty 2

## 2011-06-06 NOTE — Progress Notes (Signed)
Subjective: Complaining of abdominal pain which is diffuse no episodes of vomiting yesterday, nausea significantly improved with Reglan.  Objective: Vital signs in last 24 hours: Filed Vitals:   06/05/11 1122 06/05/11 1202 06/05/11 2109 06/06/11 0606  BP: 126/81 125/86 130/80 136/90  Pulse: 91 92 89 114  Temp:  98.7 F (37.1 C) 99.3 F (37.4 C) 99.2 F (37.3 C)  TempSrc:  Oral Oral Oral  Resp: 18 19 19 18   Height:      Weight: 63.6 kg (140 lb 3.4 oz)   63.5 kg (139 lb 15.9 oz)  SpO2: 98% 97% 97% 96%    Intake/Output Summary (Last 24 hours) at 06/06/11 1001 Last data filed at 06/05/11 1122  Gross per 24 hour  Intake      0 ml  Output   2850 ml  Net  -2850 ml    Weight change: 3 kg (6 lb 9.8 oz)  General: Alert, awake, oriented x3, in no acute distress. HEENT: No bruits, no goiter. Heart: Regular rate and rhythm, without murmurs, rubs, gallops. Lungs: Clear to auscultation bilaterally. Abdomen: Soft, nontender, nondistended, positive bowel sounds. Extremities: No clubbing cyanosis or edema with positive pedal pulses. Neuro: Grossly intact, nonfocal.    Lab Results: Results for orders placed during the hospital encounter of 05/28/11 (from the past 24 hour(s))  CBC     Status: Abnormal   Collection Time   06/06/11  6:00 AM      Component Value Range   WBC 4.3  4.0 - 10.5 (K/uL)   RBC 3.15 (*) 3.87 - 5.11 (MIL/uL)   Hemoglobin 9.2 (*) 12.0 - 15.0 (g/dL)   HCT 78.2 (*) 95.6 - 46.0 (%)   MCV 90.5  78.0 - 100.0 (fL)   MCH 29.2  26.0 - 34.0 (pg)   MCHC 32.3  30.0 - 36.0 (g/dL)   RDW 21.3 (*) 08.6 - 15.5 (%)   Platelets 270  150 - 400 (K/uL)  DIFFERENTIAL     Status: Abnormal   Collection Time   06/06/11  6:00 AM      Component Value Range   Neutrophils Relative 38 (*) 43 - 77 (%)   Lymphocytes Relative 51 (*) 12 - 46 (%)   Monocytes Relative 6  3 - 12 (%)   Eosinophils Relative 0  0 - 5 (%)   Basophils Relative 0  0 - 1 (%)   Band Neutrophils 2  0 - 10 (%)   Metamyelocytes Relative 0     Myelocytes 3     Promyelocytes Absolute 0     Blasts 0     nRBC 0  0 (/100 WBC)   Neutro Abs 1.9  1.7 - 7.7 (K/uL)   Lymphs Abs 2.1  0.7 - 4.0 (K/uL)   Monocytes Absolute 0.3  0.1 - 1.0 (K/uL)   Eosinophils Absolute 0.0  0.0 - 0.7 (K/uL)   Basophils Absolute 0.0  0.0 - 0.1 (K/uL)   RBC Morphology POLYCHROMASIA PRESENT     WBC Morphology TOXIC GRANULATION       Micro: Recent Results (from the past 240 hour(s))  CULTURE, BLOOD (ROUTINE X 2)     Status: Normal   Collection Time   05/28/11  1:58 PM      Component Value Range Status Comment   Specimen Description BLOOD LEFT FOREARM   Final    Special Requests BOTTLES DRAWN AEROBIC AND ANAEROBIC 4CC EACH   Final    Culture NO GROWTH 5 DAYS  Final    Report Status 06/02/2011 FINAL   Final   CULTURE, BLOOD (ROUTINE X 2)     Status: Normal   Collection Time   05/28/11  2:14 PM      Component Value Range Status Comment   Specimen Description BLOOD RIGHT ARM   Final    Special Requests BOTTLES DRAWN AEROBIC AND ANAEROBIC 6CC   Final    Culture NO GROWTH 5 DAYS   Final    Report Status 06/02/2011 FINAL   Final   MRSA PCR SCREENING     Status: Normal   Collection Time   05/28/11  8:04 PM      Component Value Range Status Comment   MRSA by PCR NEGATIVE  NEGATIVE  Final   CULTURE, BLOOD (SINGLE)     Status: Normal   Collection Time   05/29/11  4:25 PM      Component Value Range Status Comment   Specimen Description BLOOD HEMODIALYSIS CATHETER   Final    Special Requests BOTTLES DRAWN AEROBIC AND ANAEROBIC 5CC   Final    Setup Time [id number]   Final    Culture NO GROWTH 5 DAYS   Final    Report Status 06/04/2011 FINAL   Final   CLOSTRIDIUM DIFFICILE BY PCR     Status: Abnormal   Collection Time   05/30/11 10:32 AM      Component Value Range Status Comment   C difficile by pcr POSITIVE (*) NEGATIVE  Final   PATHOLOGIST SMEAR REVIEW     Status: Normal   Collection Time   06/01/11  7:00 AM       Component Value Range Status Comment   Tech Review Severe absolute neutropenia   Final   CULTURE, BLOOD (ROUTINE X 2)     Status: Normal (Preliminary result)   Collection Time   06/02/11 11:26 AM      Component Value Range Status Comment   Specimen Description BLOOD CENTRAL LINE   Final    Special Requests BOTTLES DRAWN AEROBIC AND ANAEROBIC 10CC   Final    Setup Time [id number]   Final    Culture     Final    Value:        BLOOD CULTURE RECEIVED NO GROWTH TO DATE CULTURE WILL BE HELD FOR 5 DAYS BEFORE ISSUING A FINAL NEGATIVE REPORT   Report Status PENDING   Incomplete   CULTURE, BLOOD (ROUTINE X 2)     Status: Normal (Preliminary result)   Collection Time   06/02/11 11:28 AM      Component Value Range Status Comment   Specimen Description BLOOD CENTRAL LINE   Final    Special Requests BOTTLES DRAWN AEROBIC AND ANAEROBIC 10CC   Final    Setup Time [id number]   Final    Culture     Final    Value:        BLOOD CULTURE RECEIVED NO GROWTH TO DATE CULTURE WILL BE HELD FOR 5 DAYS BEFORE ISSUING A FINAL NEGATIVE REPORT   Report Status PENDING   Incomplete   CATH TIP CULTURE     Status: Normal   Collection Time   06/02/11  4:30 PM      Component Value Range Status Comment   Specimen Description CATH TIP   Final    Special Requests NONE   Final    Culture NO GROWTH 2 DAYS   Final    Report Status 06/05/2011 FINAL   Final  Studies/Results: Dg Chest Port 1 View  06/04/2011  *RADIOLOGY REPORT*     IMPRESSION: Right central venous catheter placement - no evidence of pneumothorax.  Continued left lower lobe opacity - question airspace disease verses atelectasis.  Original Report Authenticated By: Rosendo Gros, M.D.   Dg Fluoro Guide Cv Line-no Report  06/04/2011  CLINICAL DATA: Dialysis Catheter placement   FLOURO GUIDE CV LINE  Fluoroscopy was utilized by the requesting physician.  No radiographic  interpretation.      Medications:  Results for orders placed during the  hospital encounter of 05/28/11 (from the past 24 hour(s))  CBC     Status: Abnormal   Collection Time   06/06/11  6:00 AM      Component Value Range   WBC 4.3  4.0 - 10.5 (K/uL)   RBC 3.15 (*) 3.87 - 5.11 (MIL/uL)   Hemoglobin 9.2 (*) 12.0 - 15.0 (g/dL)   HCT 16.1 (*) 09.6 - 46.0 (%)   MCV 90.5  78.0 - 100.0 (fL)   MCH 29.2  26.0 - 34.0 (pg)   MCHC 32.3  30.0 - 36.0 (g/dL)   RDW 04.5 (*) 40.9 - 15.5 (%)   Platelets 270  150 - 400 (K/uL)  DIFFERENTIAL     Status: Abnormal   Collection Time   06/06/11  6:00 AM      Component Value Range   Neutrophils Relative 38 (*) 43 - 77 (%)   Lymphocytes Relative 51 (*) 12 - 46 (%)   Monocytes Relative 6  3 - 12 (%)   Eosinophils Relative 0  0 - 5 (%)   Basophils Relative 0  0 - 1 (%)   Band Neutrophils 2  0 - 10 (%)   Metamyelocytes Relative 0     Myelocytes 3     Promyelocytes Absolute 0     Blasts 0     nRBC 0  0 (/100 WBC)   Neutro Abs 1.9  1.7 - 7.7 (K/uL)   Lymphs Abs 2.1  0.7 - 4.0 (K/uL)   Monocytes Absolute 0.3  0.1 - 1.0 (K/uL)   Eosinophils Absolute 0.0  0.0 - 0.7 (K/uL)   Basophils Absolute 0.0  0.0 - 0.1 (K/uL)   RBC Morphology POLYCHROMASIA PRESENT     WBC Morphology TOXIC GRANULATION      Assessment:   1) ESRD- had a permanent hemodialysis catheter placed on the 23rd. Will continue to dialyze MWF in the hospital. Eventual Plan is for Peritoneal Dialysis.    2) fever/Enterobacter cloacea Sepsis- currently on Ceftazidime, to be given with hemodialysis X-ray suggestive of left lower lobe infiltrate.Dr Daiva Eves recommends to continue with two weeks of IV ceftaz beyond removal of her HD catheter and with clean followup cultures .  3.) C.Difficile ID recommends vancomycin 125 mg 4 times a day for 2 weeks AFTER she finsihes therapy for her enterobacter bacteremia   4) Leukopenia neutropenia, proving status post hematology consultation  5) HTN - amlodipine discontinued, due to low blood pressure.  6) Anemia -begin ESA Rx  (aranep--done) ,[name], MD, consulted, at this time there is no indication to start Neupogen, conservator be resolving spontaneously, check daily CBC with differential,   #7 nausea likely secondary to medications by mouth such as by mouth vancomycin, Reglan is helping.  #8 healthcare associated pneumonia Continue ceftaz and vancomycin that was added. Would keep vancomycin going for 7 days total and then dc .  Likely DC home Monday or Tuesday  LOS: 9 days   Monica Guerra 06/06/2011, 10:01 AM

## 2011-06-06 NOTE — Progress Notes (Signed)
Physical Therapy Evaluation Patient Details Name: Monica Guerra MRN: [id number] DOB: July 25, 1985 Today's Date: 06/06/2011  Problem List:  Patient Active Problem List  Diagnoses  . Back pain, chronic  . ESRD (end stage renal disease) on dialysis  . Polycystic kidney disease, congenital  . HTN (hypertension)  . Chronic pancreatitis  . Enterobacter Cloacea Sepsis  . Leukopenia    Past Medical History:  Past Medical History  Diagnosis Date  . Renal failure   . Polycystic kidney disease   . Chronic renal failure   . Hemodialysis status   . Anemia   . Congenital abnormalities     Orofacial digital syndrome   Past Surgical History:  Past Surgical History  Procedure Date  . Central venous catheter insertion     left chest  . Cleft palate repair     PT Assessment/Plan/Recommendation PT Assessment Clinical Impression Statement: pt is deconditioned young woman who seems to have a developmental delay and decreased motivation for mobility.  pt would benefit from the Mobility Team to continue to encourage activity and build endurance.   PT Recommendation/Assessment: Patent does not need any further PT services (Will add pt to Mobility Team.  ) No Skilled PT: All education completed;Patient is independent with all acitivity/mobility PT Goals     PT Evaluation Precautions/Restrictions    Prior Functioning  Home Living Lives With: Family Receives Help From: Family Type of Home: House Home Layout: One level Home Access: Stairs to enter [employer] of Steps: couple Prior Function Level of Independence: Independent with basic ADLs;Independent with gait;Independent with transfers Able to Take Stairs?: Yes Driving: No Cognition Cognition Orientation Level: Oriented X4 Cognition - Other Comments: pt seems to have developmental delay and slow to process at times.   Sensation/Coordination   Extremity Assessment RLE Assessment RLE Assessment: Within Functional  Limits LLE Assessment LLE Assessment: Within Functional Limits Mobility (including Balance) Bed Mobility Bed Mobility: Yes Supine to Sit: 7: Independent Sit to Supine - Right: 7: Independent Transfers Transfers: Yes Sit to Stand: 7: Independent Stand to Sit: 7: Independent Ambulation/Gait Ambulation/Gait: Yes Ambulation/Gait Assistance: 7: Independent Ambulation Distance (Feet): 200 Feet Assistive device: None Gait Pattern: Shuffle (?shuffles secondary to fatigue or not wanting to walk.  ) Stairs: No Wheelchair Mobility Wheelchair Mobility: No    Exercise    End of Session PT - End of Session Equipment Utilized During Treatment:  (None) Activity Tolerance: Patient tolerated treatment well Patient left: in bed;with call bell in reach;with family/visitor present Nurse Communication: Mobility status for ambulation (Mobility Team) General Behavior During Session: [facility] for tasks performed Cognition: [facility] for tasks performed  Sunny Schlein, [place] [phone] 06/06/2011, 2:48 PM

## 2011-06-06 NOTE — Progress Notes (Signed)
Subjective: Co nausea still, no bm past several days  Objective: Weight change: 6 lb 9.8 oz (3 kg) No intake or output data in the 24 hours ending 06/06/11 1652 Blood pressure 135/96, pulse 91, temperature 98.4 F (36.9 C), temperature source Oral, resp. rate 20, height 4\' 11"  (1.499 m), weight 139 lb 15.9 oz (63.5 kg), last menstrual period 04/23/2011, SpO2 97.00%. Temp:  [98.4 F (36.9 C)-99.3 F (37.4 C)] 98.4 F (36.9 C) (11/25 1430) Pulse Rate:  [89-114] 91  (11/25 1430) Resp:  [18-20] 20  (11/25 1430) BP: (130-136)/(80-96) 135/96 mmHg (11/25 1430) SpO2:  [96 %-97 %] 97 % (11/25 1430) Weight:  [139 lb 15.9 oz (63.5 kg)] 139 lb 15.9 oz (63.5 kg) (11/25 0606)  Physical Exam: General: Alert and awake, oriented x3, not in any acute distress. HEENT: anicteric sclera, pupils reactive to light and accommodation, EOMI CVS regular rate, normal r,  no murmur rubs or gallops Chest: clear to auscultation bilaterally, no wheezing, rales or rhonchi Abdomen: slightly distended, pos bowel sounds Skin: no rashes Neuro: nonfocal  Lab Results:  Basename 06/06/11 0600 06/05/11 0530  WBC 4.3 3.0*  HGB 9.2* 8.5*  HCT 28.5* 26.1*  PLT 270 246   BMET  Basename 06/04/11 1623 06/04/11 1015  NA 143 141  K 3.7 3.5  CL 105 105  CO2 21 21  GLUCOSE 82 82  BUN 23 21  CREATININE 8.92* 8.24*  CALCIUM 10.0 9.7    Micro Results: Recent Results (from the past 240 hour(s))  CULTURE, BLOOD (ROUTINE X 2)     Status: Normal   Collection Time   05/28/11  1:58 PM      Component Value Range Status Comment   Specimen Description BLOOD LEFT FOREARM   Final    Special Requests BOTTLES DRAWN AEROBIC AND ANAEROBIC 4CC EACH   Final    Culture NO GROWTH 5 DAYS   Final    Report Status 06/02/2011 FINAL   Final   CULTURE, BLOOD (ROUTINE X 2)     Status: Normal   Collection Time   05/28/11  2:14 PM      Component Value Range Status Comment   Specimen Description BLOOD RIGHT ARM   Final    Special  Requests BOTTLES DRAWN AEROBIC AND ANAEROBIC 6CC   Final    Culture NO GROWTH 5 DAYS   Final    Report Status 06/02/2011 FINAL   Final   MRSA PCR SCREENING     Status: Normal   Collection Time   05/28/11  8:04 PM      Component Value Range Status Comment   MRSA by PCR NEGATIVE  NEGATIVE  Final   CULTURE, BLOOD (SINGLE)     Status: Normal   Collection Time   05/29/11  4:25 PM      Component Value Range Status Comment   Specimen Description BLOOD HEMODIALYSIS CATHETER   Final    Special Requests BOTTLES DRAWN AEROBIC AND ANAEROBIC 5CC   Final    Setup Time [id number]   Final    Culture NO GROWTH 5 DAYS   Final    Report Status 06/04/2011 FINAL   Final   CLOSTRIDIUM DIFFICILE BY PCR     Status: Abnormal   Collection Time   05/30/11 10:32 AM      Component Value Range Status Comment   C difficile by pcr POSITIVE (*) NEGATIVE  Final   PATHOLOGIST SMEAR REVIEW     Status: Normal   Collection Time  06/01/11  7:00 AM      Component Value Range Status Comment   Tech Review Severe absolute neutropenia   Final   CULTURE, BLOOD (ROUTINE X 2)     Status: Normal (Preliminary result)   Collection Time   06/02/11 11:26 AM      Component Value Range Status Comment   Specimen Description BLOOD CENTRAL LINE   Final    Special Requests BOTTLES DRAWN AEROBIC AND ANAEROBIC 10CC   Final    Setup Time [id number]   Final    Culture     Final    Value:        BLOOD CULTURE RECEIVED NO GROWTH TO DATE CULTURE WILL BE HELD FOR 5 DAYS BEFORE ISSUING A FINAL NEGATIVE REPORT   Report Status PENDING   Incomplete   CULTURE, BLOOD (ROUTINE X 2)     Status: Normal (Preliminary result)   Collection Time   06/02/11 11:28 AM      Component Value Range Status Comment   Specimen Description BLOOD CENTRAL LINE   Final    Special Requests BOTTLES DRAWN AEROBIC AND ANAEROBIC 10CC   Final    Setup Time [id number]   Final    Culture     Final    Value:        BLOOD CULTURE RECEIVED NO GROWTH TO DATE CULTURE  WILL BE HELD FOR 5 DAYS BEFORE ISSUING A FINAL NEGATIVE REPORT   Report Status PENDING   Incomplete   CATH TIP CULTURE     Status: Normal   Collection Time   06/02/11  4:30 PM      Component Value Range Status Comment   Specimen Description CATH TIP   Final    Special Requests NONE   Final    Culture NO GROWTH 2 DAYS   Final    Report Status 06/05/2011 FINAL   Final     Studies/Results: Ct Abdomen Pelvis Wo Contrast  05/15/2011  **ADDENDUM** CREATED: 05/15/2011 17:18:13  After reviewing the nonenhanced CT along with today's ultrasound, there is likely asymmetric left intrahepatic biliary ductal dilatation.  I would recommend further evaluation with MRCP to assess for obstructing stone or lesion.  **END ADDENDUM** SIGNED BY: Aubery Lapping. Dover, M.D.   05/15/2011  *RADIOLOGY REPORT*  Clinical Data: Back pain, fever.  CT ABDOMEN AND PELVIS WITHOUT CONTRAST  Technique:  Multidetector CT imaging of the abdomen and pelvis was performed following the standard protocol without intravenous contrast.  Comparison: 04/21/2011  Findings: Dependent atelectasis in the lung bases.  Trace pleural effusions.  Heart is upper limits normal in size.  Periportal areas of low density again noted as seen on prior study, possibly periportal edema.  This could also represent intrahepatic biliary ductal dilatation, but it is difficult to determine without IV contrast.  Spleen is upper limits normal in size.  No definite focal lesion.  Pancreatic calcifications are noted compatible with chronic pancreatitis.  There is a small amount of stranding adjacent to the pancreatic tail and extending into the left anterior pararenal space.  There is also stranding in the right anterior pararenal space.  Findings may reflect early or mild changes of acute pancreatitis.  Recommend clinical correlation. Small amount of free fluid is seen in the pelvis.  Uterus and adnexa are unremarkable.  Large and small bowel grossly unremarkable.  No acute  bony abnormality.  IMPRESSION: Changes of chronic pancreatitis with calcifications present.  There is stranding near the pancreas and extending in the anterior  pararenal spaces bilaterally suggesting the possibility of acute pancreatitis.  Question periportal edema versus intrahepatic biliary ductal dilatation.  It is difficult to differentiate without IV contrast.  Bibasilar atelectasis, trace effusions.  Original Report Authenticated By: Cyndie Chime, M.D.   Dg Chest 2 View  05/14/2011  *RADIOLOGY REPORT*  Clinical Data: Fever.  CHEST - 2 VIEW  Comparison: None.  Findings: Left dialysis catheter is in place.  The distal tip was directed laterally into the lateral wall of the upper SVC.  Heart is normal size.  Lungs are clear.  No confluent opacity or effusion.  No acute bony abnormality.  IMPRESSION: No active cardiopulmonary disease.  Original Report Authenticated By: Cyndie Chime, M.D.   Mr Lumbar Spine Wo Contrast  05/15/2011  *RADIOLOGY REPORT*  Clinical Data: Back pain and fever.  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 Evaluate for diskitis or abscess.  MRI LUMBAR SPINE WITHOUT CONTRAST  Technique:  Multiplanar and multiecho pulse sequences of the lumbar spine were obtained without intravenous contrast.  Comparison: Abdominal pelvic CT 05/14/2011.  Findings: CT demonstrates five lumbar type vertebral bodies. Alignment is normal.  There is no evidence of diskitis or osteomyelitis.  There is no evidence of fracture or pars defect.  The conus medullaris extends to the L1-L2 level and appears normal. No focal paraspinal abnormalities are identified.  As demonstrated on recent CT, there is mild retroperitoneal edema.  In addition, there is mild edema within the erector spinae musculature bilaterally.  Both kidneys are grossly abnormal with numerous cysts.  The kidneys are not significantly enlarged.  There is no hydronephrosis.  All of the lumbar discs are well hydrated.  There is a shallow right  paracentral disc protrusion at T11-T12.  There are no significant disc space findings from T12-L1 through L3-L4.  L4-L5:  Minimal disc bulge.  There is minimal fluid in the facet joints bilaterally.  There is no subchondral sclerosis or destruction.  There is no foraminal compromise or nerve root encroachment.  L5-S1:  Minimal facet hypertrophy.  No spinal stenosis or nerve root encroachment.  IMPRESSION:  1.  No evidence of lumbar diskitis or osteomyelitis. 2.  No focal paraspinal fluid collections are identified.  There is retroperitoneal edema as well as mild edema in the posterior paraspinal musculature. 3.  Mild facet hypertrophy at L4-L5 and L5-S1. 4.  Multicystic kidneys.  Original Report Authenticated By: Gerrianne Scale, M.D.   US Abdomen Complete  05/15/2011  *RADIOLOGY REPORT*  Clinical Data: Pancreatitis.  Evaluate for gallstones. History of congenital polycystic kidney disease with a stage V chronic kidney disease.  ABDOMEN ULTRASOUND  Technique:  Complete abdominal ultrasound examination was performed including evaluation of the liver, gallbladder, bile ducts, pancreas, kidneys, spleen, IVC, and abdominal aorta.  Comparison: CT scan from 05/14/2011.  Findings:  Gallbladder:  Gallbladder wall is not well distended.  No gallstones are evident, but gallbladder wall thickness is at upper normal measuring 3 mm.  There is no pericholecystic fluid.  The sonographer reports no sonographic Murphy's sign.  Common Bile Duct:  Nondilated at 3 mm diameter.  Liver:  The no focal intraparenchymal abnormality.  Main portal vein is prominent.  No substantial intrahepatic biliary duct dilatation.  IVC:  Normal.  Pancreas:  Pancreatic head and tail are poorly visualized secondary to overlying, obscuring bowel gas.  Spleen:  Unremarkable  Right kidney:  9.6 cm in long axis.  Diffusely abnormal appearance with cortical thinning and multiple cystic areas, some of which are relatively well  defined and others which are  not.  Largest identified cyst measures about 2.4 cm.  Left kidney:  13.7 cm in long axis.  Similar appearance to the contralateral kidney with numerous cystic areas of varying sizes, some of the cystic areas are poorly defined.  Abdominal Aorta:  No aneurysm.  IMPRESSION: Gallbladder is not well distended, but no gallstones are evident. Gallbladder wall thickness is borderline increased without pericholecystic fluid or sonographic Murphy's sign.  Markedly abnormal appearance to the kidneys with cortical thinning and diffuse cystic change bilaterally.  The the patient has a history of congenital renal cystic disease and chronic renal insufficiency.  Original Report Authenticated By: ERIC A. MANSELL, M.D.   Mr Mrcp  05/19/2011  *RADIOLOGY REPORT*  Clinical Data: MRCP, chronic pancreatitis.  MRI ABDOMEN WITHOUT CONTRAST (MRCP)  Technique:  Multiplanar multisequence MR imaging of the abdomen was performed, including heavily T2-weighted images of the biliary and pancreatic ducts.  Three-dimensional MR images were rendered by post processing of the original MR data.  Comparison:  CT 05/14/2011, ultrasound 05/15/2011  Findings:  No pericardial fluid or pleural fluid.  There is near complete replacement of the renal parenchyma by multiple renal cysts consistent with polycystic renal disease.  There is biliary ductal ectasia and small cystic lesions along the left and right intrahepatic biliary ducts.  There are several focal cystic ductal lesions measuring up to 12 mm within the left ductal system.  The ductal ectasia/cystic change is more prominent within the lateral hepatic lobe (image 26, series 2).  There is no obstructing lesion evident at the confluence of the hepatic ducts. The common bile duct and common hepatic duct are normal caliber. The pancreatic duct is not dilated.  There is a thin accessory duct to communicating with the main pancreatic duct (ductus divisum) .  There are multiple small cystic lesions  within the uncinate process of the pancreas, pancreatic body, and pancreatic tail.  There is best seen on coronal image 15 and 19, series 2.  There is no focal hepatic lesion.  The gallbladder is collapsed. The spleen is normal.  Adrenal glands appear normal.  Abdominal aorta is normal.  No skeletal lesion evident.  IMPRESSION:  1.  Complete replacement of the kidneys by innumerable cysts consistent with polycystic kidney disease. 2.  Extensive biliary ductal ectasia more severe within the left hepatic lobe but involving all hepatic ducts is  related to the polycystic kidney disease.  3.  Ductal ectasia and cystic change within the pancreatic head, body and tail also related to polycystic kidney disease. 4.  Ductus divisum variant anatomy.  No evidence of acute pancreatitis.  Original Report Authenticated By: Genevive Bi, M.D.   Mr 3d Recon At Scanner  05/19/2011  *RADIOLOGY REPORT*  Clinical Data: MRCP, chronic pancreatitis.  MRI ABDOMEN WITHOUT CONTRAST (MRCP)  Technique:  Multiplanar multisequence MR imaging of the abdomen was performed, including heavily T2-weighted images of the biliary and pancreatic ducts.  Three-dimensional MR images were rendered by post processing of the original MR data.  Comparison:  CT 05/14/2011, ultrasound 05/15/2011  Findings:  No pericardial fluid or pleural fluid.  There is near complete replacement of the renal parenchyma by multiple renal cysts consistent with polycystic renal disease.  There is biliary ductal ectasia and small cystic lesions along the left and right intrahepatic biliary ducts.  There are several focal cystic ductal lesions measuring up to 12 mm within the left ductal system.  The ductal ectasia/cystic change is more prominent within the  lateral hepatic lobe (image 26, series 2).  There is no obstructing lesion evident at the confluence of the hepatic ducts. The common bile duct and common hepatic duct are normal caliber. The pancreatic duct is not  dilated.  There is a thin accessory duct to communicating with the main pancreatic duct (ductus divisum) .  There are multiple small cystic lesions within the uncinate process of the pancreas, pancreatic body, and pancreatic tail.  There is best seen on coronal image 15 and 19, series 2.  There is no focal hepatic lesion.  The gallbladder is collapsed. The spleen is normal.  Adrenal glands appear normal.  Abdominal aorta is normal.  No skeletal lesion evident.  IMPRESSION:  1.  Complete replacement of the kidneys by innumerable cysts consistent with polycystic kidney disease. 2.  Extensive biliary ductal ectasia more severe within the left hepatic lobe but involving all hepatic ducts is  related to the polycystic kidney disease.  3.  Ductal ectasia and cystic change within the pancreatic head, body and tail also related to polycystic kidney disease. 4.  Ductus divisum variant anatomy.  No evidence of acute pancreatitis.  Original Report Authenticated By: Genevive Bi, M.D.   Dg Chest Port 1 View  06/02/2011  *RADIOLOGY REPORT*  Clinical Data: Pneumonia  PORTABLE CHEST - 1 VIEW  Comparison: 05/29/2011  Findings: Early airspace disease at the left base obscures the left hemidiaphragm.  Right lung is clear.  Normal heart size.  No pneumothorax.  Stable malpositioned left subclavian dialysis catheter.  IMPRESSION: New early airspace disease at the left base.  Original Report Authenticated By: Donavan Burnet, M.D.   Portable Chest Xray In Am  05/29/2011  *RADIOLOGY REPORT*  Clinical Data: Shortness of breath.  Neutropenic fever.  PORTABLE CHEST - 1 VIEW  Comparison: 05/28/2011  Findings: Stable appearance of the dialysis catheter noted with the distal tip projecting over the right mediastinal margin with a transverse orientation.  Borderline cardiomegaly noted.  There is linear subsegmental atelectasis in the lingula, but otherwise the lungs appear clear.  IMPRESSION:  1.  Stable distal orientation of the  dialysis catheter. 2.  Lingular subsegmental atelectasis. 3.  Borderline cardiomegaly.  Original Report Authenticated By: Dellia Cloud, M.D.   Dg Chest Portable 1 View  05/28/2011  *RADIOLOGY REPORT*  Clinical Data: Sepsis, fever  PORTABLE CHEST - 1 VIEW  Comparison: 05/14/2011  Findings: Lungs are clear. No pleural effusion or pneumothorax.  The heart is top normal in size.  Stable left subclavian dual lumen dialysis catheter.  IMPRESSION: No evidence of acute cardiopulmonary disease.  Original Report Authenticated By: Charline Bills, M.D.    Antibiotics:  Anti-infectives     Start     Dose/Rate Route Frequency Ordered Stop   06/07/11 1200   cefTAZidime (FORTAZ) 2 g in dextrose 5 % 50 mL IVPB        2 g 100 mL/hr over 30 Minutes Intravenous Every M-W-F (Hemodialysis) 06/04/11 1759     06/05/11 1200   cefTAZidime (FORTAZ) 2 g in dextrose 5 % 50 mL IVPB        2 g 100 mL/hr over 30 Minutes Intravenous Every T-Th-Sa (Hemodialysis) 06/04/11 1759 06/05/11 1324   06/04/11 1900   cefTAZidime (FORTAZ) 2 g in dextrose 5 % 50 mL IVPB        2 g 100 mL/hr over 30 Minutes Intravenous  Once 06/04/11 1758 06/04/11 2207   06/04/11 1200   vancomycin (VANCOCIN) 750 mg in sodium chloride  0.9 % 150 mL IVPB        750 mg 150 mL/hr over 60 Minutes Intravenous Every M-W-F (Hemodialysis) 06/02/11 1241     06/02/11 1800   vancomycin (VANCOCIN) 50 mg/mL oral solution 125 mg        125 mg Oral 4 times daily 06/02/11 1330     06/02/11 1500   vancomycin (VANCOCIN) 1,500 mg in sodium chloride 0.9 % 500 mL IVPB        1,500 mg 250 mL/hr over 120 Minutes Intravenous  Once 06/02/11 1241 06/02/11 1638   06/02/11 1400   metroNIDAZOLE (FLAGYL) tablet 500 mg  Status:  Discontinued        500 mg Oral 3 times per day 06/02/11 1112 06/02/11 1331   06/02/11 1200   ceFEPIme (MAXIPIME) 2 g in dextrose 5 % 50 mL IVPB  Status:  Discontinued        2 g 100 mL/hr over 30 Minutes Intravenous Every M-W-F  (Hemodialysis) 06/01/11 1117 06/04/11 1734   06/01/11 1400   ciprofloxacin (CIPRO) IVPB 400 mg  Status:  Discontinued        400 mg 200 mL/hr over 60 Minutes Intravenous Every 24 hours 06/01/11 1133 06/01/11 1258   06/01/11 1200   ceFEPIme (MAXIPIME) 1 g in dextrose 5 % 50 mL IVPB        1 g 100 mL/hr over 30 Minutes Intravenous NOW 06/01/11 1117 06/01/11 1314   05/31/11 1600   metroNIDAZOLE (FLAGYL) IVPB 500 mg  Status:  Discontinued        500 mg 100 mL/hr over 60 Minutes Intravenous Every 8 hours 05/31/11 1505 06/02/11 1112   05/31/11 1200   tobramycin (NEBCIN) 132 mg in dextrose 5 % 50 mL IVPB  Status:  Discontinued        132 mg 106.6 mL/hr over 30 Minutes Intravenous Every M-W-F (Hemodialysis) 05/29/11 0408 06/01/11 1115   05/30/11 1400   metroNIDAZOLE (FLAGYL) tablet 500 mg  Status:  Discontinued        500 mg Oral 3 times per day 05/30/11 1339 05/31/11 1505   05/29/11 0600   piperacillin-tazobactam (ZOSYN) IVPB 2.25 g  Status:  Discontinued        2.25 g 100 mL/hr over 30 Minutes Intravenous 3 times per day 05/29/11 0420 06/01/11 1115   05/29/11 0415   tobramycin (NEBCIN) 172 mg in dextrose 5 % 50 mL IVPB        172 mg 108.6 mL/hr over 30 Minutes Intravenous  Once 05/29/11 0408 05/29/11 0728   05/28/11 1400   vancomycin (VANCOCIN) IVPB 1000 mg/200 mL premix        1,000 mg 200 mL/hr over 60 Minutes Intravenous  Once 05/28/11 1356 05/28/11 1634   05/28/11 1400  piperacillin-tazobactam (ZOSYN) IVPB 3.375 g       3.375 g 12.5 mL/hr over 240 Minutes Intravenous  Once 05/28/11 1356 05/28/11 1833          Medications: Scheduled Meds:    . cefTAZidime (FORTAZ) IV  2 g Intravenous Q M,W,F-HD  . darbepoetin (ARANESP) injection - DIALYSIS  150 mcg Intravenous Q Wed-HD  . heparin  5,000 Units Subcutaneous Q8H  . lipase/protease/amylase  2 capsule Oral TID WC  . metoCLOPramide (REGLAN) injection  5 mg Intravenous Q8H  . saccharomyces boulardii  250 mg Oral BID  .  vancomycin  125 mg Oral QID  . vancomycin  750 mg Intravenous Q M,W,F-HD   Continuous Infusions:  PRN Meds:.sodium  chloride, sodium chloride, acetaminophen, albuterol, diphenhydrAMINE, feeding supplement (NEPRO CARB STEADY), heparin, heparin, heparin, heparin, lidocaine, lidocaine-prilocaine, morphine injection, ondansetron (ZOFRAN) IV, ondansetron, oxyCODONE-acetaminophen, pentafluoroprop-tetrafluoroeth, promethazine  Assessment/Plan: Monica Guerra is a 25 y.o. 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 She has recently started hemodialysis in the last 5 weeks, however, has had difficulty with maintaining an HD line due to occlusion, sShe was transferred from [facility] on 05/28/2011 after being found to have GNR bacteremia, later identified as enterobacter cloacae, likely from a left subclavian central catheter used for hemodialysis. She had fever (tmax 103 F), chills, myalgia and non-productive cough. She was started on empiric antibiotics and WBC was >10K. Antibiotics included Zosyn,Tobra (with HD),and Vanco. She has been subsquently found to have C difficile colitis and also neutropenia. Her HD line was removed on the 21st. She was simplified to cefepim for her enterobacter infection and oral vancomycin for her C difficile colitis. She was started on IV vancomyinc due to concer for opacities in lungs on portable film and followup continues to show infiltrate   Enterobacter Bacteremia: Per Dr. Lowell Guitar Enterobacter was Ancef R otherwise sensitive (labs at [facility] HD center) --changed to Ceftazidime, more narrow and can be given with HD -- I would like to make sure she gets two weeks of IV ceftaz beyond removal of her HD catheter and with clean followup cultures  C difficile colitis: --continue oral vancomycin, would continue for 2 weeks AFTER she finsihes therapy for her enterobacter bacteremia --check portable film  abdomen given lack of bm  ?HCAP: --Continue ceftaz and vancomycin that was added. Would keep vancomycin going for 7 days total and then dc  I will followup on abdominal films but if no new findings will sign off. Please call with further questions.      .  LOS: 9 days   Acey Lav 06/06/2011, 4:52 PM

## 2011-06-06 NOTE — Progress Notes (Signed)
S:C/O nausea. Overall better O:BP 136/90  Pulse 114  Temp(Src) 99.2 F (37.3 C) (Oral)  Resp 18  Ht 4\' 11"  (1.499 m)  Wt 63.5 kg (139 lb 15.9 oz)  BMI 28.27 kg/m2  SpO2 96%  LMP 04/23/2011 No intake or output data in the 24 hours ending 06/06/11 1300 Weight change: 3 kg (6 lb 9.8 oz) YNW:GNFA OZH:YQMV and nt Ext:1+  PC r chest Ok    . cefTAZidime (FORTAZ) IV  2 g Intravenous Q T,Th,Sa-HD  . cefTAZidime (FORTAZ) IV  2 g Intravenous Q M,W,F-HD  . darbepoetin (ARANESP) injection - DIALYSIS  150 mcg Intravenous Q Wed-HD  . heparin  5,000 Units Subcutaneous Q8H  . lipase/protease/amylase  2 capsule Oral TID WC  . metoCLOPramide (REGLAN) injection  5 mg Intravenous Q8H  . saccharomyces boulardii  250 mg Oral BID  . vancomycin  125 mg Oral QID  . vancomycin  750 mg Intravenous Q M,W,F-HD   Dg Chest Port 1 View  06/04/2011  *RADIOLOGY REPORT*  Clinical Data: 25 year old female - status post Diatek catheter insertion.  PORTABLE CHEST - 1 VIEW  Comparison: 06/03/2011  Findings: The cardiomediastinal silhouette is unremarkable. A right IJ central venous catheter is identified with tips overlying the lower SVC and cavoatrial junction. Left lower lobe opacity is again identified. There is no evidence of pulmonary edema, pulmonary nodule/mass, pleural effusion, or pneumothorax. No acute bony abnormalities are identified.  IMPRESSION: Right central venous catheter placement - no evidence of pneumothorax.  Continued left lower lobe opacity - question airspace disease verses atelectasis.  Original Report Authenticated By: Rosendo Gros, M.D.   Dg Fluoro Guide Cv Line-no Report  06/04/2011  CLINICAL DATA: Dialysis Catheter placement   FLOURO GUIDE CV LINE  Fluoroscopy was utilized by the requesting physician.  No radiographic  interpretation.     BMET  Lab 06/04/11 1623 06/04/11 1015 06/02/11 0849 06/01/11 0700 05/31/11 0530  NA 143 141 142 138 140  K 3.7 3.5 3.7 3.8 4.7  CL 105 105 105 100  103  CO2 21 21 22 22 19   GLUCOSE 82 82 79 80 94  BUN 23 21 29* 21 41*  CREATININE 8.92* 8.24* 7.83* 6.06* 8.86*  ALB -- -- -- -- --  CALCIUM 10.0 9.7 9.5 9.1 9.6  PHOS -- 4.7* -- -- --   CBC  Lab 06/06/11 0600 06/05/11 0530 06/04/11 1623 06/04/11 1015  WBC 4.3 3.0* 2.6* 2.6*  NEUTROABS 1.9 0.5* 0.5* 0.2*  HGB 9.2* 8.5* 8.8* 8.1*  HCT 28.5* 26.1* 26.7* 24.4*  MCV 90.5 90.6 89.6 90.0  PLT 270 246 213 181     Assessment/Plan: 1) ESRD- new to dialysis 5 weeks ago Eventual Plan is for Peritoneal Dialysis.  2) Catheter related Enterobacter cloacea Sepsis- On Ceftazidime.Marland KitchenMarland KitchenS/P catheter removal & replacement   3)C.Difficile Infection  4) Leukopenia ---mutlifactorial, resolved 5) HTN - amlodipine.  6) Anemia - ESA Rx  Hemodialysis Monday to get back on schedule. To follow up with [facility]  Kalib Bhagat C

## 2011-06-07 ENCOUNTER — Inpatient Hospital Stay (HOSPITAL_COMMUNITY): Payer: PRIVATE HEALTH INSURANCE

## 2011-06-07 DIAGNOSIS — J189 Pneumonia, unspecified organism: Secondary | ICD-10-CM

## 2011-06-07 LAB — DIFFERENTIAL
Band Neutrophils: 3 % (ref 0–10)
Blasts: 0 %
Eosinophils Absolute: 0.1 10*3/uL (ref 0.0–0.7)
Eosinophils Relative: 1 % (ref 0–5)
Metamyelocytes Relative: 2 %
Monocytes Absolute: 0.9 10*3/uL (ref 0.1–1.0)
Monocytes Relative: 16 % — ABNORMAL HIGH (ref 3–12)
Myelocytes: 7 %
Smear Review: ADEQUATE

## 2011-06-07 LAB — CBC
HCT: 27.5 % — ABNORMAL LOW (ref 36.0–46.0)
MCH: 29.8 pg (ref 26.0–34.0)
MCV: 91.1 fL (ref 78.0–100.0)
RDW: 15.8 % — ABNORMAL HIGH (ref 11.5–15.5)
WBC: 5.7 10*3/uL (ref 4.0–10.5)

## 2011-06-07 MED ORDER — VANCOMYCIN HCL 1000 MG IV SOLR: INTRAVENOUS | Status: DC

## 2011-06-07 MED ORDER — VANCOMYCIN 50 MG/ML ORAL SOLUTION: ORAL | Status: DC

## 2011-06-07 MED ORDER — VANCOMYCIN HCL 1000 MG IV SOLR: 1250.0000 mg | Freq: Two times a day (BID) | INTRAVENOUS | Status: AC

## 2011-06-07 MED ORDER — GUAIFENESIN-CODEINE 100-10 MG/5ML PO SOLN
10.0000 mL | Freq: Once | ORAL | Status: DC
  Filled 2011-06-07: qty 10

## 2011-06-07 MED ORDER — SACCHAROMYCES BOULARDII 250 MG PO CAPS: 250.0000 mg | ORAL_CAPSULE | Freq: Two times a day (BID) | ORAL | Status: AC

## 2011-06-07 MED ORDER — METOCLOPRAMIDE HCL 5 MG PO TABS: 5.0000 mg | ORAL_TABLET | Freq: Three times a day (TID) | ORAL | Status: AC

## 2011-06-07 MED ORDER — DARBEPOETIN ALFA-POLYSORBATE 150 MCG/0.3ML IJ SOLN: 150.0000 ug | INTRAMUSCULAR | Status: DC

## 2011-06-07 MED ORDER — CEFTAZIDIME 2 G IV SOLR: 2.0000 g | INTRAVENOUS | Status: DC

## 2011-06-07 NOTE — Discharge Summary (Signed)
Physician Discharge Summary  Monica Guerra MRN: [id number] DOB/AGE: 11-15-1985 25 y.o.  PCP: Colette Ribas, MD   Admit date: 05/28/2011 Discharge date: 06/07/2011  Discharge Diagnoses:       *Pneumonia  Anemia C. difficile diarrhea Placement of R IJ tunneled cath ESRD (end stage renal disease) on dialysis  Polycystic kidney disease, congenital  HTN (hypertension)  Chronic pancreatitis  Enterobacter Cloacea Sepsis  Leukopenia  orofacial digital syndrome   Current Discharge Medication List    START taking these medications   Details  Ceftazidime (FORTAZ) 2 G SOLR Inject 2 g into the vein 3 (three) times a week. Qty: 30 each, Refills: 0    darbepoetin (ARANESP) 150 MCG/0.3ML SOLN Inject 0.3 mLs (150 mcg total) into the vein every Wednesday with hemodialysis. Qty: 1.68 mL, Refills: 0    metoCLOPramide (REGLAN) 5 MG tablet Take 1 tablet (5 mg total) by mouth 3 (three) times daily with meals. Qty: 90 tablet, Refills: 2    saccharomyces boulardii (FLORASTOR) 250 MG capsule Take 1 capsule (250 mg total) by mouth 2 (two) times daily. Qty: 60 capsule, Refills: 0    sodium chloride 0.9 % SOLN 250 mL with vancomycin 1000 MG SOLR 1,250 mg Inject 1,250 mg into the vein every 12 (twelve) hours. Qty: 1 ampule, Refills: 0    vancomycin (VANCOCIN) 50 mg/mL oral solution 125 mg by mouth every 6 hours for one month Qty: 1000 mL, Refills: 2    Vancomycin IV 750 mg IV on Monday Wednesday Friday   CONTINUE these medications which have NOT CHANGED   Details  acetaminophen (TYLENOL) 500 MG tablet Take 1,000 mg by mouth every 6 (six) hours as needed. Pain     amLODipine (NORVASC) 5 MG tablet Take 5 mg by mouth daily.      furosemide (LASIX) 20 MG tablet Take 20 mg by mouth 2 (two) times daily.      lipase/protease/amylase (CREON-10/PANCREASE) 12000 UNITS CPEP Take 2 capsules by mouth 3 (three) times daily with meals. Qty: 270 capsule, Refills: 0    omeprazole (PRILOSEC)  20 MG capsule Take 20 mg by mouth daily. Upset Stomach    Probiotic Product (PROBIOTIC FORMULA PO) Take 1 tablet by mouth daily.          Discharge Condition: Stable Disposition: Home  Consults: Lauris Poag, MD Chuck Hint, MD Acey Lav, MD    Significant Diagnostic Studies: Ct Abdomen Pelvis Wo Contrast  05/15/2011  **ADDENDUM** CREATED: 05/15/2011 17:18:13  After reviewing the nonenhanced CT along with today's ultrasound, there is likely asymmetric left intrahepatic biliary ductal dilatation.  I would recommend further evaluation with MRCP to assess for obstructing stone or lesion.  **END ADDENDUM** SIGNED BY: Aubery Lapping. Dover, M.D.   05/15/2011  *RADIOLOGY REPORT*  Cl IMPRESSION: Changes of chronic pancreatitis with calcifications present.  There is stranding near the pancreas and extending in the anterior pararenal spaces bilaterally suggesting the possibility of acute pancreatitis.  Question periportal edema versus intrahepatic biliary ductal dilatation.  It is difficult to differentiate without IV contrast.  Bibasilar atelectasis, trace effusions.  Original Report Authenticated By: Cyndie Chime, M.D.   Dg Chest 1 View  06/03/2011  *   IMPRESSION: Left lower lobe pneumonia.  Original Report Authenticated By: Bernadene Bell. Maricela Curet, M.D.   Dg Chest 2 View  05/14/2011  *RADIOLOGY REPORT*  Clinical Data: Fever.  CHEST - 2 VIEW  Comparison: None.  Findings: Left dialysis catheter is in place.  The distal  tip was directed laterally into the lateral wall of the upper SVC.  Heart is normal size.  Lungs are clear.  No confluent opacity or effusion.  No acute bony abnormality.  IMPRESSION: No active cardiopulmonary disease.  Original Report Authenticated By: Cyndie Chime, M.D.   Dg Abd 1 View  06/06/2011     IMPRESSION: Normal abdominal film.  Original Report Authenticated By: Reola Calkins, M.D.   Mr Lumbar Spine Wo Contrast  05/15/2011  *   IMPRESSION:  1.  No  evidence of lumbar diskitis or osteomyelitis. 2.  No focal paraspinal fluid collections are identified.  There is retroperitoneal edema as well as mild edema in the posterior paraspinal musculature. 3.  Mild facet hypertrophy at L4-L5 and L5-S1. 4.  Multicystic kidneys.  Original Report Authenticated By: Gerrianne Scale, M.D.   US Abdomen Complete  05/15/2011  *  IMPRESSION: Gallbladder is not well distended, but no gallstones are evident. Gallbladder wall thickness is borderline increased without pericholecystic fluid or sonographic Murphy's sign.  Markedly abnormal appearance to the kidneys with cortical thinning and diffuse cystic change bilaterally.  The the patient has a history of congenital renal cystic disease and chronic renal insufficiency.  Original Report Authenticated By: ERIC A. MANSELL, M.D.   Mr Mrcp  05/19/2011  *RADIOLOGY REPORT*   IMPRESSION:  1.  Complete replacement of the kidneys by innumerable cysts consistent with polycystic kidney disease. 2.  Extensive biliary ductal ectasia more severe within the left hepatic lobe but involving all hepatic ducts is  related to the polycystic kidney disease.  3.  Ductal ectasia and cystic change within the pancreatic head, body and tail also related to polycystic kidney disease. 4.  Ductus divisum variant anatomy.  No evidence of acute pancreatitis.  Original Report Authenticated By: Genevive Bi, M.D.   Mr 3d Recon At Scanner  05/19/2011  * IMPRESSION:  1.  Complete replacement of the kidneys by innumerable cysts consistent with polycystic kidney disease. 2.  Extensive biliary ductal ectasia more severe within the left hepatic lobe but involving all hepatic ducts is  related to the polycystic kidney disease.  3.  Ductal ectasia and cystic change within the pancreatic head, body and tail also related to polycystic kidney disease. 4.  Ductus divisum variant anatomy.  No evidence of acute pancreatitis.  Original Report Authenticated By: Genevive Bi, M.D.   Dg Chest Port 1 View  06/04/2011  *RADIOLOGY REPORT*  Clinical Data: 25 year old female - status post Diatek catheter insertion.  PORTABLE CHEST - 1 VIEW  Comparison: 06/03/2011  Findings: The cardiomediastinal silhouette is unremarkable. A right IJ central venous catheter is identified with tips overlying the lower SVC and cavoatrial junction. Left lower lobe opacity is again identified. There is no evidence of pulmonary edema, pulmonary nodule/mass, pleural effusion, or pneumothorax. No acute bony abnormalities are identified.  IMPRESSION: Right central venous catheter placement - no evidence of pneumothorax.  Continued left lower lobe opacity - question airspace disease verses atelectasis.  Original Report Authenticated By: Rosendo Gros, M.D.   Dg Chest Port 1 View  06/02/2011  *RADIOLOGY REPORT*  Clinical Data: Pneumonia  PORTABLE CHEST - 1 VIEW  Comparison: 05/29/2011  Findings: Early airspace disease at the left base obscures the left hemidiaphragm.  Right lung is clear.  Normal heart size.  No pneumothorax.  Stable malpositioned left subclavian dialysis catheter.  IMPRESSION: New early airspace disease at the left base.  Original Report Authenticated By: Donavan Burnet, M.D.  Portable Chest Xray In Am  05/29/2011  *RADIOLOGY REPORT*  Clinical Data: Shortness of breath.  Neutropenic fever.  PORTABLE CHEST - 1 VIEW  Comparison: 05/28/2011  Findings: Stable appearance of the dialysis catheter noted with the distal tip projecting over the right mediastinal margin with a transverse orientation.  Borderline cardiomegaly noted.  There is linear subsegmental atelectasis in the lingula, but otherwise the lungs appear clear.  IMPRESSION:  1.  Stable distal orientation of the dialysis catheter. 2.  Lingular subsegmental atelectasis. 3.  Borderline cardiomegaly.  Original Report Authenticated By: Dellia Cloud, M.D.   Dg Chest Portable 1 View  05/28/2011  *RADIOLOGY REPORT*   Clinical Data: Sepsis, fever  PORTABLE CHEST - 1 VIEW  Comparison: 05/14/2011  Findings: Lungs are clear. No pleural effusion or pneumothorax.  The heart is top normal in size.  Stable left subclavian dual lumen dialysis catheter.  IMPRESSION: No evidence of acute cardiopulmonary disease.  Original Report Authenticated By: Charline Bills, M.D.   Dg Fluoro Guide Cv Line-no Report  06/04/2011  CLINICAL DATA: Dialysis Catheter placement   FLOURO GUIDE CV LINE  Fluoroscopy was utilized by the requesting physician.  No radiographic  interpretation.        Microbiology: Recent Results (from the past 240 hour(s))  CULTURE, BLOOD (ROUTINE X 2)     Status: Normal   Collection Time   05/28/11  1:58 PM      Component Value Range Status Comment   Specimen Description BLOOD LEFT FOREARM   Final    Special Requests BOTTLES DRAWN AEROBIC AND ANAEROBIC 4CC EACH   Final    Culture NO GROWTH 5 DAYS   Final    Report Status 06/02/2011 FINAL   Final   CULTURE, BLOOD (ROUTINE X 2)     Status: Normal   Collection Time   05/28/11  2:14 PM      Component Value Range Status Comment   Specimen Description BLOOD RIGHT ARM   Final    Special Requests BOTTLES DRAWN AEROBIC AND ANAEROBIC 6CC   Final    Culture NO GROWTH 5 DAYS   Final    Report Status 06/02/2011 FINAL   Final   MRSA PCR SCREENING     Status: Normal   Collection Time   05/28/11  8:04 PM      Component Value Range Status Comment   MRSA by PCR NEGATIVE  NEGATIVE  Final   CULTURE, BLOOD (SINGLE)     Status: Normal   Collection Time   05/29/11  4:25 PM      Component Value Range Status Comment   Specimen Description BLOOD HEMODIALYSIS CATHETER   Final    Special Requests BOTTLES DRAWN AEROBIC AND ANAEROBIC 5CC   Final    Setup Time [id number]   Final    Culture NO GROWTH 5 DAYS   Final    Report Status 06/04/2011 FINAL   Final   CLOSTRIDIUM DIFFICILE BY PCR     Status: Abnormal   Collection Time   05/30/11 10:32 AM      Component Value  Range Status Comment   C difficile by pcr POSITIVE (*) NEGATIVE  Final   PATHOLOGIST SMEAR REVIEW     Status: Normal   Collection Time   06/01/11  7:00 AM      Component Value Range Status Comment   Tech Review Severe absolute neutropenia   Final   CULTURE, BLOOD (ROUTINE X 2)     Status: Normal (Preliminary result)  Collection Time   06/02/11 11:26 AM      Component Value Range Status Comment   Specimen Description BLOOD CENTRAL LINE   Final    Special Requests BOTTLES DRAWN AEROBIC AND ANAEROBIC 10CC   Final    Setup Time [id number]   Final    Culture     Final    Value:        BLOOD CULTURE RECEIVED NO GROWTH TO DATE CULTURE WILL BE HELD FOR 5 DAYS BEFORE ISSUING A FINAL NEGATIVE REPORT   Report Status PENDING   Incomplete   CULTURE, BLOOD (ROUTINE X 2)     Status: Normal (Preliminary result)   Collection Time   06/02/11 11:28 AM      Component Value Range Status Comment   Specimen Description BLOOD CENTRAL LINE   Final    Special Requests BOTTLES DRAWN AEROBIC AND ANAEROBIC 10CC   Final    Setup Time [id number]   Final    Culture     Final    Value:        BLOOD CULTURE RECEIVED NO GROWTH TO DATE CULTURE WILL BE HELD FOR 5 DAYS BEFORE ISSUING A FINAL NEGATIVE REPORT   Report Status PENDING   Incomplete   CATH TIP CULTURE     Status: Normal   Collection Time   06/02/11  4:30 PM      Component Value Range Status Comment   Specimen Description CATH TIP   Final    Special Requests NONE   Final    Culture NO GROWTH 2 DAYS   Final    Report Status 06/05/2011 FINAL   Final      Labs: Results for orders placed during the hospital encounter of 05/28/11 (from the past 48 hour(s))  CBC     Status: Abnormal   Collection Time   06/06/11  6:00 AM      Component Value Range Comment   WBC 4.3  4.0 - 10.5 (K/uL)    RBC 3.15 (*) 3.87 - 5.11 (MIL/uL)    Hemoglobin 9.2 (*) 12.0 - 15.0 (g/dL)    HCT 21.3 (*) 08.6 - 46.0 (%)    MCV 90.5  78.0 - 100.0 (fL)    MCH 29.2  26.0 - 34.0  (pg)    MCHC 32.3  30.0 - 36.0 (g/dL)    RDW 57.8 (*) 46.9 - 15.5 (%)    Platelets 270  150 - 400 (K/uL)   DIFFERENTIAL     Status: Abnormal   Collection Time   06/06/11  6:00 AM      Component Value Range Comment   Neutrophils Relative 38 (*) 43 - 77 (%)    Lymphocytes Relative 51 (*) 12 - 46 (%)    Monocytes Relative 6  3 - 12 (%)    Eosinophils Relative 0  0 - 5 (%)    Basophils Relative 0  0 - 1 (%)    Band Neutrophils 2  0 - 10 (%)    Metamyelocytes Relative 0      Myelocytes 3      Promyelocytes Absolute 0      Blasts 0      nRBC 0  0 (/100 WBC)    Neutro Abs 1.9  1.7 - 7.7 (K/uL)    Lymphs Abs 2.1  0.7 - 4.0 (K/uL)    Monocytes Absolute 0.3  0.1 - 1.0 (K/uL)    Eosinophils Absolute 0.0  0.0 - 0.7 (K/uL)  Basophils Absolute 0.0  0.0 - 0.1 (K/uL)    RBC Morphology POLYCHROMASIA PRESENT      WBC Morphology TOXIC GRANULATION   MILD LEFT SHIFT (1-5% METAS, OCC MYELO, OCC BANDS)  CBC     Status: Abnormal   Collection Time   06/07/11  5:52 AM      Component Value Range Comment   WBC 5.7  4.0 - 10.5 (K/uL) WHITE COUNT CONFIRMED ON SMEAR   RBC 3.02 (*) 3.87 - 5.11 (MIL/uL)    Hemoglobin 9.0 (*) 12.0 - 15.0 (g/dL)    HCT 16.1 (*) 09.6 - 46.0 (%)    MCV 91.1  78.0 - 100.0 (fL)    MCH 29.8  26.0 - 34.0 (pg)    MCHC 32.7  30.0 - 36.0 (g/dL)    RDW 04.5 (*) 40.9 - 15.5 (%)    Platelets 291  150 - 400 (K/uL)   DIFFERENTIAL     Status: Abnormal   Collection Time   06/07/11  5:52 AM      Component Value Range Comment   Neutrophils Relative 27 (*) 43 - 77 (%)    Lymphocytes Relative 41  12 - 46 (%)    Monocytes Relative 16 (*) 3 - 12 (%)    Eosinophils Relative 1  0 - 5 (%)    Basophils Relative 0  0 - 1 (%)    Band Neutrophils 3  0 - 10 (%)    Metamyelocytes Relative 2      Myelocytes 7      Promyelocytes Absolute 3      Blasts 0      nRBC 0  0 (/100 WBC)    Neutro Abs 2.4  1.7 - 7.7 (K/uL)    Lymphs Abs 2.3  0.7 - 4.0 (K/uL)    Monocytes Absolute 0.9  0.1 - 1.0 (K/uL)     Eosinophils Absolute 0.1  0.0 - 0.7 (K/uL)    Basophils Absolute 0.0  0.0 - 0.1 (K/uL)    RBC Morphology RARE NRBCs   POLYCHROMASIA PRESENT   WBC Morphology     TOXIC GRANULATION   Value: MODERATE LEFT SHIFT (>5% METAS AND MYELOS,OCC PRO NOTED)   Smear Review PLATELETS APPEAR ADEQUATE        HPI :The patient is a 25 y.o. year-old with polycystic kidney disease on dialysis via tunnelled catheter for 5 weeks now. She is being considered for PD cath placement at [facility] to eventually do PD. She presented with a 2day hx of fevers. They started last week at dialysis and blood cultures were drawn and antibiotics given. The cultures are growing positive for gram neg rods. The patient required admission after labs showed low WBC of 0.9 and fevers were persisting. She has defervesced, the catheter was still present on admission and she had no complaints. Her usual HD schedule is MWF with Dr. Fausto Skillern in [place]. She denies any cough, chest pain, abd pain. She did develop some diarrhea in the hospital, but that is improving. She was transferred from [facility] on 05/28/2011 after being found to have GNR bacteremia, later identified as enterobacter cloacae, likely from a left subclavian central catheter used for hemodialysis. She had fever (tmax 103 F), chills, myalgia and non-productive cough. She was started on empiric antibiotics and WBC was >10K. Antibiotics included Zosyn,Tobra (with HD),and Vanco. She mentioned that in addition to having fevers with HD for the past 5 days, she has also noticed having worsening diarrhea and N/V. Her  work up revealed that she has c.difficile infection. Her admit labs were also remarkable for new onset leukopenia/neutropenia with a WBC of 0.9. Repeat WBC in the evening of 11/16 showed WBC to be 2.2 with ANC 1.5. On 11/21, she had her HD line removed, but she still reports having loose stools and persistent nausea.     HOSPITAL COURSE:    A1) enterobacter  cloacae bacteremia = she was started on cefepime and vancomycin, she had her hemodialysis catheter removed by Dr. Edilia Bo, and had a ultrasound guided placement of right IJ dietetic catheter on November 23.Per Dr. Lowell Guitar Enterobacter was Ancef R otherwise sensitive (labs at [facility] HD center) . Therefore infectious disease changed cefepime   to Ceftazidime, more narrow and can be given with HD.--She should get   two weeks of IV ceftaz beyond removal of her HD catheter which was on the 23rd and with clean followup cultures .  #2 healthcare associated pneumonia confirmed by chest x-ray twice, continue ceftaz edema and vancomycin. Ceftaz  will be continued for 2 weeks. Vancomycin for one week. Both to be administered with hemodialysis.  #3  c.difficile colities = initially started on Flagyl, switched to by mouth vancomycin. Per Dr. Algis Liming continue oral vancomycin, would continue for 2 weeks AFTER she finsihes therapy for her enterobacter bacteremia.  #4   neutropenia= Dr. [name], MD  Consulted on the patient for neutropenia and thrombo cytopenia. Review of the peripheral blood film shows adequate platelets, unremarkable red cells (specifically no schistocytes), and mostly unremarkable lymphs in the [facility] Cell series. There are no blasts, nucleated red blood cells, or other markers of primary marrow disease.  Per Dr. 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 He did not feel the need to start neupogen, as counts appear to be resolving spontaneously and patient is not unstable from an infectious point of view.  Her neutropenia subsequently resolved with an ANC greater than thousand     Discharge Exam: Blood pressure 133/88, pulse 105, temperature 98.1 F (36.7 C), temperature source Oral, resp.  rate 16, height 4\' 11"  (1.499 m), weight 63.5 kg (139 lb 15.9 oz), last menstrual period 04/23/2011, SpO2 96.00%.  General: Alert, awake, oriented x3, in no acute distress. HEENT: No bruits, no goiter. Heart: Regular rate and rhythm, without murmurs, rubs, gallops. Lungs: Clear to auscultation bilaterally. Abdomen: Soft, nontender, nondistended, positive bowel sounds. Extremities: No clubbing cyanosis or edema with positive pedal pulses. Neuro: Grossly intact, nonfocal.     Discharge Orders    Future Appointments: Provider: Department: Dept Phone: Center:   06/14/2011 10:00 AM Llana Aliment, NP [facility] Lionel December [phone] None     Future Orders Please Complete By Expires   Diet - low sodium heart healthy      Increase activity slowly      Call MD for:  temperature >100.4      Call MD for:  persistant nausea and vomiting      Call MD for:  difficulty breathing, headache or visual disturbances         Follow-up Information    Follow up with GOLDING,JOHN CABOT in 1 week.   Contact information:   [address] [place] [address] [place] [address] [phone]       Follow up with  [facility]  in 1 week.         SignedRicharda Overlie 06/07/2011, 12:24 PM

## 2011-06-07 NOTE — Progress Notes (Signed)
I was present at this session.  I have reviewed the session itself and made appropriate changes.  Zada Girt 11/26/20128:21 AM Ms. Ryback is currently on HD via R IJ tunneled cath.  She eventually wants to do peritoneal dialysis.  PD catheter should not be placed until we are certain that blood cultures are negative 2 weeks after discontinuation of all antibiotics.  She get fortaz (2g three times a week)  in dialysis  ([place] at the [facility] dialysis unit) and will get po vancomycin for + c. Diff. per regimens as outlined by Dr. Daiva Eves yesterday.  WBC has come up since transfer to [facility]

## 2011-06-08 ENCOUNTER — Encounter (HOSPITAL_COMMUNITY): Payer: Self-pay | Admitting: Vascular Surgery

## 2011-06-08 LAB — CULTURE, BLOOD (ROUTINE X 2)
Culture  Setup Time: 201211211818
Culture: NO GROWTH

## 2011-06-14 ENCOUNTER — Ambulatory Visit (INDEPENDENT_AMBULATORY_CARE_PROVIDER_SITE_OTHER): Payer: PRIVATE HEALTH INSURANCE | Admitting: Internal Medicine

## 2011-06-17 ENCOUNTER — Encounter (INDEPENDENT_AMBULATORY_CARE_PROVIDER_SITE_OTHER): Payer: Self-pay | Admitting: *Deleted

## 2011-06-29 ENCOUNTER — Encounter: Payer: Self-pay | Admitting: Cardiology

## 2011-06-30 ENCOUNTER — Ambulatory Visit (INDEPENDENT_AMBULATORY_CARE_PROVIDER_SITE_OTHER): Payer: PRIVATE HEALTH INSURANCE | Admitting: Cardiology

## 2011-06-30 ENCOUNTER — Encounter: Payer: Self-pay | Admitting: Cardiology

## 2011-06-30 VITALS — BP 133/95 | HR 90 | Resp 18 | Ht 64.0 in | Wt 146.0 lb

## 2011-06-30 DIAGNOSIS — I10 Essential (primary) hypertension: Secondary | ICD-10-CM

## 2011-06-30 DIAGNOSIS — N186 End stage renal disease: Secondary | ICD-10-CM

## 2011-06-30 DIAGNOSIS — R000 Tachycardia, unspecified: Secondary | ICD-10-CM

## 2011-06-30 NOTE — Assessment & Plan Note (Signed)
Secondary to polycystic kidney disease, followed by Dr. Kristian Covey.

## 2011-06-30 NOTE — Progress Notes (Signed)
Clinical Summary Monica Guerra is a 25 y.o.female referred for cardiology consultation by Dr. Kristian Covey. Records were reviewed including hospitalization in November. During dialysis sessions over last month, she is reported to have had documented elevations in heart rate after her treatments, particularly when standing. Heart rate reported as fast as the 150s, not associated with any dizziness or shortness of breath. She has been treated with saline on most occasions with reduction in heart rate. Otherwise she reports no sense of palpitations, does not notice any particularly fast heart rates on her non-dialysis days. She also denies having any recent fevers or chills, no achiness, no change in appetite. She is being managed with broad-spectrum antibiotics in light of her recent bacteremia from November.  She has no history of cardiac arrhythmia. Recent echocardiogram from 11/17 showed LVEF of 60-65% without regional wall motion abnormalities, normal diastolic parameters, small pericardial effusion located posteriorly.  Orthostatics were checked today in the office as noted in EMR. There was no significant blood pressure change, patient was asymptomatic throughout. Heart rate did increase from 67 supine to 90 standing.   Allergies  Allergen Reactions  . Vancomycin Itching    Patient usually takes Benadryl Before.     Medication list reviewed.  Past Medical History  Diagnosis Date  . ESRD on hemodialysis   . Polycystic kidney disease   . Anemia of chronic disease   . Congenital abnormalities     Orofacial digital syndrome  . Essential hypertension, benign   . History of Clostridium difficile infection   . History of bacteremia     Enterobacter cloacae 11/12    Past Surgical History  Procedure Date  . Central venous catheter insertion     Left chest  . Cleft palate repair   . Insertion of dialysis catheter 06/04/2011    Procedure: INSERTION OF DIALYSIS CATHETER;  Surgeon: Chuck Hint, MD;  Location: [facility] OR;  Service: Vascular;  Laterality: Right;  28cm dialysis catheter placed in Right Internal Jugular    History reviewed. No pertinent family history.  Social History Monica Guerra reports that she has never smoked. She does not have any smokeless tobacco history on file. Monica Guerra reports that she does not drink alcohol.  Review of Systems As noted above, no palpitations or shortness of breath. No syncope. No chest pain. No orthopnea or PND. No lower extremity edema. She reports some soreness around her hemodialysis catheter in the right upper chest. No redness or drainage however. No fevers or chills. Otherwise negative.  Physical Examination Filed Vitals:   06/30/11 1054  BP: 133/95  Pulse: 90  Resp:    Short statured young woman in no acute distress. HEENT: Conjunctiva and lids normal, patient status post cleft palate repair, oropharynx with moist mucosa. Neck: Supple, no elevated JVP or carotid bruits, no thyromegaly. Lungs: Clear, nonlabored. Thorax: Hemodialysis catheter in right upper chest, dressed, clean with no erythema or obvious drainage. Cardiac: Regular rate and rhythm, no significant murmur or gallop, no rub. Abdomen: Soft, nontender, bowel sounds present. Extremities: No pitting edema, distal pulses full. Skin: Warm and dry, no rashes. Musculoskeletal: No kyphosis. Neuropsychiatric: Alert and oriented x3, affect appropriate.  ECG Normal sinus rhythm with PR interval 232 ms, heart rate 81.   Problem List and Plan

## 2011-06-30 NOTE — Assessment & Plan Note (Signed)
Described at the end or following completion of patient's hemodialysis sessions, particularly with standing. Heart rate reported to be as high as the150s at times. She reports no associated palpitations or dizziness at these times. It seems very likely that this could be a manifestation of aggressive volume removal with resulting physiologic response to maintain blood pressure. Supporting this would be improvement in heart rate following institution of saline. She is not frankly orthostatic by blood pressure today, although does have some increase in her heart rate when she stands. ECG shows a prolonged PR interval, but is otherwise normal. Recent echocardiogram was also reassuring. Doubt that the previously documented small posterior pericardial effusion is of hemodynamic significance, and should likely have reduced anyway in the setting of hemodialysis. One would anticipate an elevated heart rate at all times in the setting of a larger effusion with hemodynamic significance. The possibility of a transient arrhythmia could be considered, although nothing to support this has yet been documented. Suggestion at this point would be to consider altering the patient's dry weight to avoid aggressive volume removal that may be aggravating orthostatic heart rate change. Would also be mindful for signs of recurrent infection since this has been a recently documented problem. If she has documented elevations in heart rate, particularly up into the 150s, without any obvious precipitant that can be correlated, a cardiac monitor might be considered. Followup can be as needed at this point.

## 2011-06-30 NOTE — Patient Instructions (Signed)
Your physician recommends that you continue on your current medications as directed. Please refer to the Current Medication list given to you today.  Your physician recommends that you schedule a follow-up appointment in: as needed  

## 2011-06-30 NOTE — Assessment & Plan Note (Signed)
Treated with Norvasc. Peripheral vasodilatation with this agent may also contribute to exaggerated heart rate response when standing, particularly in a state of volume contraction.

## 2011-08-12 ENCOUNTER — Ambulatory Visit (HOSPITAL_COMMUNITY)
Admission: RE | Admit: 2011-08-12 | Discharge: 2011-08-12 | Disposition: A | Discharge status: Home / Self Care | Payer: PRIVATE HEALTH INSURANCE | Source: Ambulatory Visit | Attending: Family Medicine | Admitting: Family Medicine

## 2011-08-12 ENCOUNTER — Other Ambulatory Visit (HOSPITAL_COMMUNITY): Payer: Self-pay | Admitting: Family Medicine

## 2011-08-12 DIAGNOSIS — R0789 Other chest pain: Secondary | ICD-10-CM

## 2011-08-12 DIAGNOSIS — R079 Chest pain, unspecified: Secondary | ICD-10-CM | POA: Insufficient documentation

## 2011-08-16 ENCOUNTER — Emergency Department (HOSPITAL_COMMUNITY)
Admission: EM | Admit: 2011-08-16 | Discharge: 2011-08-17 | Disposition: A | Discharge status: Home / Self Care | Payer: PRIVATE HEALTH INSURANCE | Attending: Emergency Medicine | Admitting: Emergency Medicine

## 2011-08-16 ENCOUNTER — Encounter (HOSPITAL_COMMUNITY): Payer: Self-pay | Admitting: *Deleted

## 2011-08-16 ENCOUNTER — Emergency Department (HOSPITAL_COMMUNITY): Payer: PRIVATE HEALTH INSURANCE

## 2011-08-16 DIAGNOSIS — Z992 Dependence on renal dialysis: Secondary | ICD-10-CM | POA: Insufficient documentation

## 2011-08-16 DIAGNOSIS — Z862 Personal history of diseases of the blood and blood-forming organs and certain disorders involving the immune mechanism: Secondary | ICD-10-CM | POA: Insufficient documentation

## 2011-08-16 DIAGNOSIS — I120 Hypertensive chronic kidney disease with stage 5 chronic kidney disease or end stage renal disease: Secondary | ICD-10-CM | POA: Insufficient documentation

## 2011-08-16 DIAGNOSIS — K859 Acute pancreatitis without necrosis or infection, unspecified: Secondary | ICD-10-CM | POA: Insufficient documentation

## 2011-08-16 DIAGNOSIS — N186 End stage renal disease: Secondary | ICD-10-CM | POA: Insufficient documentation

## 2011-08-16 DIAGNOSIS — Q898 Other specified congenital malformations: Secondary | ICD-10-CM | POA: Insufficient documentation

## 2011-08-16 LAB — DIFFERENTIAL
Basophils Absolute: 0 10*3/uL (ref 0.0–0.1)
Eosinophils Relative: 5 % (ref 0–5)
Lymphocytes Relative: 31 % (ref 12–46)
Lymphs Abs: 2.1 10*3/uL (ref 0.7–4.0)
Monocytes Absolute: 0.4 10*3/uL (ref 0.1–1.0)
Monocytes Relative: 5 % (ref 3–12)
Neutro Abs: 3.9 10*3/uL (ref 1.7–7.7)

## 2011-08-16 LAB — CBC
HCT: 29.9 % — ABNORMAL LOW (ref 36.0–46.0)
Hemoglobin: 10.3 g/dL — ABNORMAL LOW (ref 12.0–15.0)
MCV: 91.4 fL (ref 78.0–100.0)
RBC: 3.27 MIL/uL — ABNORMAL LOW (ref 3.87–5.11)
RDW: 13.7 % (ref 11.5–15.5)
WBC: 6.7 10*3/uL (ref 4.0–10.5)

## 2011-08-16 LAB — COMPREHENSIVE METABOLIC PANEL
ALT: 6 U/L (ref 0–35)
AST: 16 U/L (ref 0–37)
CO2: 25 mEq/L (ref 19–32)
Calcium: 11.1 mg/dL — ABNORMAL HIGH (ref 8.4–10.5)
Chloride: 100 mEq/L (ref 96–112)
Creatinine, Ser: 9.33 mg/dL — ABNORMAL HIGH (ref 0.50–1.10)
GFR calc Af Amer: 6 mL/min — ABNORMAL LOW (ref >90)
GFR calc non Af Amer: 5 mL/min — ABNORMAL LOW (ref >90)
Glucose, Bld: 100 mg/dL — ABNORMAL HIGH (ref 70–99)
Total Bilirubin: 0.2 mg/dL — ABNORMAL LOW (ref 0.3–1.2)

## 2011-08-16 MED ORDER — MORPHINE SULFATE 4 MG/ML IJ SOLN
4.0000 mg | Freq: Once | INTRAMUSCULAR | Status: AC
  Administered 2011-08-16: 4 mg via INTRAVENOUS
  Filled 2011-08-16: qty 1

## 2011-08-16 MED ORDER — SODIUM CHLORIDE 0.9 % IV SOLN
INTRAVENOUS | Status: DC
  Administered 2011-08-16: via INTRAVENOUS

## 2011-08-16 MED ORDER — ONDANSETRON HCL 4 MG/2ML IJ SOLN
4.0000 mg | Freq: Four times a day (QID) | INTRAMUSCULAR | Status: DC | PRN
  Administered 2011-08-16: 4 mg via INTRAVENOUS
  Filled 2011-08-16: qty 2

## 2011-08-16 NOTE — ED Notes (Signed)
Dialysis patient, c/o severe abdominal pain

## 2011-08-16 NOTE — ED Notes (Signed)
Patient states she had surgery to have peritoneal dialysis catheter placement 2 weeks ago. States she had peritoneal dialysis today. Has had abdominal pain that started a week ago. Patient states pain is diffuse in abdomen and is present everywhere except around catheter. Also states she has had back pain.

## 2011-08-16 NOTE — ED Provider Notes (Signed)
History   This chart was scribed for Ward Givens, MD by Sofie Rower. The patient was seen in room APA05/APA05 and the patient's care was started at 11:19PM.    CSN: [id number]  Arrival date & time 08/16/11  2211   First MD Initiated Contact with Patient 08/16/11 2309      Chief Complaint  Patient presents with  . Abdominal Pain    (Consider location/radiation/quality/duration/timing/severity/associated sxs/prior Treatment) HPI  Mother relates child has had periumbilical left upper quadrant pain for the past week. It was intermittent but now is getting constant. She's had nausea without vomiting or diarrhea. They deny constipation or fever. Mother states she had pancreatitis once before but that was different per the patient. Patient has polycystic ovary disease and has been on hemodialysis for several months. She goes on Monday Wednesday Friday and she had dialysis Friday ( today is Monday). She had a peritoneal dialysis catheter placed 2 weeks ago by Dr. Aundria Rud in [facility]. Mother states she started her perineal dialysis today. She states the dialysis nurse did it. Mother states that the fluid was clear when it came out. She relates she started having pain before they started the peritoneal dialysis.    Modifying factors include lying down which intensifies the pain.   PCP is Dr. Phillips Odor and sees his PA Terie Purser  Past Medical History  Diagnosis Date  . ESRD on hemodialysis   . Polycystic kidney disease   . Anemia of chronic disease   . Congenital abnormalities     Orofacial digital syndrome  . Essential hypertension, benign   . History of Clostridium difficile infection   . History of bacteremia     Enterobacter cloacae 11/12    Past Surgical History  Procedure Date  . Central venous catheter insertion     Left chest  . Cleft palate repair   . Insertion of dialysis catheter 06/04/2011    Procedure: INSERTION OF DIALYSIS CATHETER;  Surgeon: Chuck Hint, MD;  Location: [facility] OR;  Service: Vascular;  Laterality: Right;  28cm dialysis catheter placed in Right Internal Jugular    No family history on file.  History  Substance Use Topics  . Smoking status: Never Smoker   . Smokeless tobacco: Not on file  . Alcohol Use: No   lives at home with parents  OB History    Grav Para Term Preterm Abortions TAB SAB Ect Mult Living                  Review of Systems  All other systems reviewed and are negative.    Allergies  Vancomycin  Home Medications   Current Outpatient Rx  Name Route Sig Dispense Refill  . ACETAMINOPHEN 500 MG PO TABS Oral Take 1,000 mg by mouth every 6 (six) hours as needed. Pain     . AMLODIPINE BESYLATE 5 MG PO TABS Oral Take 5 mg by mouth daily.      . FUROSEMIDE 20 MG PO TABS Oral Take 20 mg by mouth 2 (two) times daily.      Marland Kitchen SEVELAMER CARBONATE 800 MG PO TABS Oral Take 800 mg by mouth 3 (three) times daily with meals.    . CEFTAZIDIME 2 G IV SOLR Intravenous Inject 2 g into the vein 3 (three) times a week. 30 each 0    With hemodialysis for another 10 days    BP 136/97  Pulse 86  Temp 97.9 F (36.6 C)  Resp 20  Ht 5\' 4"  (1.626 m)  Wt 145 lb (65.772 kg)  BMI 24.89 kg/m2  SpO2 100%  LMP 07/22/2011  Vital signs normal    Physical Exam  Nursing note and vitals reviewed. Constitutional: She is oriented to person, place, and time. She appears well-developed and well-nourished. She appears distressed.       Patient appears painful, she cries when she is laid flatter to examine her abdomen  HENT:  Head: Normocephalic and atraumatic.  Right Ear: External ear normal.  Left Ear: External ear normal.  Nose: Nose normal.       Mucus membranes minimally dry  Eyes: Conjunctivae and EOM are normal. Pupils are equal, round, and reactive to light. No scleral icterus.  Neck: Normal range of motion. Neck supple.  Cardiovascular: Normal rate, regular rhythm, normal heart sounds and intact distal pulses.   Exam reveals no gallop and no friction rub.   No murmur heard. Pulmonary/Chest: Effort normal and breath sounds normal. No respiratory distress.  Abdominal: Soft. There is tenderness (LUQ). There is guarding. There is no rebound.       Active bowel sounds, PD cath in place. Patient tender in the left upper quadrant  Musculoskeletal: Normal range of motion. She exhibits no edema.  Neurological: She is alert and oriented to person, place, and time. Coordination normal.  Skin: Skin is warm and dry. No rash noted. No erythema.  Psychiatric: Her behavior is normal.       Affect is very flat    ED Course  Procedures (including critical care time)   Medications  ondansetron (ZOFRAN) injection 4 mg (4 mg Intravenous Given 08/16/11 2340)  0.9 %  sodium chloride infusion (  Intravenous New Bag/Given 08/16/11 2339)  morphine 4 MG/ML injection 4 mg (4 mg Intravenous Given 08/16/11 2340)  morphine 4 MG/ML injection 4 mg (4 mg Intravenous Given 08/17/11 0110)   After reviewing her CT scan and her lab results with her mother, patient is sleeping in no distress. Mother wants to take her home and try outpatient treatment. She was strongly advised if she gets worse in any way to bring her back.  Results for orders placed during the hospital encounter of 08/16/11  CBC      Component Value Range   WBC 6.7  4.0 - 10.5 (K/uL)   RBC 3.27 (*) 3.87 - 5.11 (MIL/uL)   Hemoglobin 10.3 (*) 12.0 - 15.0 (g/dL)   HCT 62.1 (*) 30.8 - 46.0 (%)   MCV 91.4  78.0 - 100.0 (fL)   MCH 31.5  26.0 - 34.0 (pg)   MCHC 34.4  30.0 - 36.0 (g/dL)   RDW 65.7  84.6 - 96.2 (%)   Platelets 264  150 - 400 (K/uL)  DIFFERENTIAL      Component Value Range   Neutrophils Relative 58  43 - 77 (%)   Neutro Abs 3.9  1.7 - 7.7 (K/uL)   Lymphocytes Relative 31  12 - 46 (%)   Lymphs Abs 2.1  0.7 - 4.0 (K/uL)   Monocytes Relative 5  3 - 12 (%)   Monocytes Absolute 0.4  0.1 - 1.0 (K/uL)   Eosinophils Relative 5  0 - 5 (%)   Eosinophils  Absolute 0.4  0.0 - 0.7 (K/uL)   Basophils Relative 0  0 - 1 (%)   Basophils Absolute 0.0  0.0 - 0.1 (K/uL)  COMPREHENSIVE METABOLIC PANEL      Component Value Range   Sodium 139  135 - 145 (  mEq/L)   Potassium 4.3  3.5 - 5.1 (mEq/L)   Chloride 100  96 - 112 (mEq/L)   CO2 25  19 - 32 (mEq/L)   Glucose, Bld 100 (*) 70 - 99 (mg/dL)   BUN 30 (*) 6 - 23 (mg/dL)   Creatinine, Ser 4.09 (*) 0.50 - 1.10 (mg/dL)   Calcium 81.1 (*) 8.4 - 10.5 (mg/dL)   Total Protein 6.9  6.0 - 8.3 (g/dL)   Albumin 3.4 (*) 3.5 - 5.2 (g/dL)   AST 16  0 - 37 (U/L)   ALT 6  0 - 35 (U/L)   Alkaline Phosphatase 93  39 - 117 (U/L)   Total Bilirubin 0.2 (*) 0.3 - 1.2 (mg/dL)   GFR calc non Af Amer 5 (*) >90 (mL/min)   GFR calc Af Amer 6 (*) >90 (mL/min)  URINALYSIS, ROUTINE W REFLEX MICROSCOPIC      Component Value Range   Color, Urine STRAW (*) YELLOW    APPearance CLEAR  CLEAR    Specific Gravity, Urine 1.010  1.005 - 1.030    pH 8.0  5.0 - 8.0    Glucose, UA 100 (*) NEGATIVE (mg/dL)   Hgb urine dipstick TRACE (*) NEGATIVE    Bilirubin Urine NEGATIVE  NEGATIVE    Ketones, ur NEGATIVE  NEGATIVE (mg/dL)   Protein, ur TRACE (*) NEGATIVE (mg/dL)   Urobilinogen, UA 0.2  0.0 - 1.0 (mg/dL)   Nitrite NEGATIVE  NEGATIVE    Leukocytes, UA NEGATIVE  NEGATIVE   LIPASE, BLOOD      Component Value Range   Lipase 69 (*) 11 - 59 (U/L)  URINE MICROSCOPIC-ADD ON      Component Value Range   Squamous Epithelial / LPF MANY (*) RARE    WBC, UA 3-6  <3 (WBC/hpf)   RBC / HPF 0-2  <3 (RBC/hpf)   Bacteria, UA FEW (*) RARE    Laboratory interpretation all normal except renal failure, elevated lipase is mild, anemia   Ct Abdomen Pelvis Wo Contrast  08/17/2011  *RADIOLOGY REPORT*  Clinical Data: Left upper abdominal pain.  Recent peritoneal dialysis catheter placement.  CT ABDOMEN AND PELVIS WITHOUT CONTRAST  Technique:  Multidetector CT imaging of the abdomen and pelvis was performed following the standard protocol without  intravenous contrast.  Comparison: 05/14/2011  Findings: Trace left pleural effusion.  Minimal dependent atelectasis in the visualized left lower lobe.  Dialysis catheter tips extending to the right atrium.  There is a small pericardial effusion.  There is  probable left intrahepatic biliary ductal dilatation or ectasia as previously described.  Unremarkable uninfused evaluation of spleen.  Scattered coarse calcifications in the pancreatic head and body with some dilatation of the pancreatic duct as before. Multiple low attenuation bilateral renal lesions are again evident. There is a tunneled peritoneal dialysis catheter extending to the pelvis.  There is a small amount of adjacent fluid.  Urinary bladder is incompletely distended.  No free air.  Stomach and small bowel are nondilated.  The colon is unremarkable. Uterus unremarkable.  3.7 cm cystic right adnexal process.  IMPRESSION:  1.  Interval placement of peritoneal dialysis catheter to the pelvis with a small amount of adjacent peritoneal fluid, no evident complication. 2.  Stable cystic right adnexal lesion. 3.  Scattered pancreatic calcifications suggesting chronic pancreatitis. 4.  Cystic change of bilateral native kidneys as previously described. 5.  Small pericardial and left pleural effusions.  Original Report Authenticated By: Osa Craver, M.D.   Dg  Chest 2 View  08/12/2011  *RADIOLOGY REPORT*  Clinical Data: Chest pain.  CHEST - 2 VIEW  Comparison: 06/04/2011.  Findings: Trachea is midline.  Heart size normal.  Right IJ dialysis catheter tips project over the SVC and SVC/RA junction. Lungs are clear.  No pleural fluid.  IMPRESSION: No acute findings.  Original Report Authenticated By: Reyes Ivan, M.D.    Diagnoses that have been ruled out:  None  Diagnoses that are still under consideration:  None  Final diagnoses:  Pancreatitis   New Prescriptions   ONDANSETRON (ZOFRAN) 4 MG TABLET    Take 1 tablet (4 mg total) by mouth  every 6 (six) hours.   OXYCODONE-ACETAMINOPHEN (PERCOCET) 5-325 MG PER TABLET    Take 1 tablet by mouth every 4 (four) hours as needed for pain.   Plan discharge Devoria Albe, MD, FACEP     MDM    I personally performed the services described in this documentation, which was scribed in my presence. The recorded information has been reviewed and considered. Devoria Albe, MD, FACEP       Ward Givens, MD 08/17/11 [id number]

## 2011-08-17 LAB — URINALYSIS, ROUTINE W REFLEX MICROSCOPIC
Bilirubin Urine: NEGATIVE
Nitrite: NEGATIVE
Urobilinogen, UA: 0.2 mg/dL (ref 0.0–1.0)

## 2011-08-17 LAB — URINE MICROSCOPIC-ADD ON

## 2011-08-17 MED ORDER — OXYCODONE-ACETAMINOPHEN 5-325 MG PO TABS: 1.0000 | ORAL_TABLET | ORAL | Status: AC | PRN

## 2011-08-17 MED ORDER — ONDANSETRON HCL 4 MG PO TABS: 4.0000 mg | ORAL_TABLET | Freq: Four times a day (QID) | ORAL | Status: AC

## 2011-08-17 MED ORDER — MORPHINE SULFATE 4 MG/ML IJ SOLN
4.0000 mg | Freq: Once | INTRAMUSCULAR | Status: AC
  Administered 2011-08-17: 4 mg via INTRAVENOUS
  Filled 2011-08-17: qty 1

## 2011-08-19 ENCOUNTER — Emergency Department (HOSPITAL_COMMUNITY)
Admission: EM | Admit: 2011-08-19 | Discharge: 2011-08-19 | Disposition: A | Discharge status: Home / Self Care | Payer: PRIVATE HEALTH INSURANCE | Attending: Emergency Medicine | Admitting: Emergency Medicine

## 2011-08-19 ENCOUNTER — Encounter (HOSPITAL_COMMUNITY): Payer: Self-pay | Admitting: Emergency Medicine

## 2011-08-19 DIAGNOSIS — I120 Hypertensive chronic kidney disease with stage 5 chronic kidney disease or end stage renal disease: Secondary | ICD-10-CM | POA: Insufficient documentation

## 2011-08-19 DIAGNOSIS — Z992 Dependence on renal dialysis: Secondary | ICD-10-CM | POA: Insufficient documentation

## 2011-08-19 DIAGNOSIS — Z79899 Other long term (current) drug therapy: Secondary | ICD-10-CM | POA: Insufficient documentation

## 2011-08-19 DIAGNOSIS — N186 End stage renal disease: Secondary | ICD-10-CM | POA: Insufficient documentation

## 2011-08-19 DIAGNOSIS — R109 Unspecified abdominal pain: Secondary | ICD-10-CM | POA: Insufficient documentation

## 2011-08-19 LAB — COMPREHENSIVE METABOLIC PANEL
AST: 12 U/L (ref 0–37)
BUN: 25 mg/dL — ABNORMAL HIGH (ref 6–23)
CO2: 27 mEq/L (ref 19–32)
Chloride: 102 mEq/L (ref 96–112)
Creatinine, Ser: 7.51 mg/dL — ABNORMAL HIGH (ref 0.50–1.10)
GFR calc Af Amer: 8 mL/min — ABNORMAL LOW (ref >90)
GFR calc non Af Amer: 7 mL/min — ABNORMAL LOW (ref >90)
Glucose, Bld: 91 mg/dL (ref 70–99)
Total Bilirubin: 0.2 mg/dL — ABNORMAL LOW (ref 0.3–1.2)

## 2011-08-19 LAB — DIFFERENTIAL
Lymphocytes Relative: 42 % (ref 12–46)
Lymphs Abs: 1.9 10*3/uL (ref 0.7–4.0)
Monocytes Absolute: 0.3 10*3/uL (ref 0.1–1.0)
Monocytes Relative: 8 % (ref 3–12)
Neutro Abs: 2 10*3/uL (ref 1.7–7.7)

## 2011-08-19 LAB — CBC
HCT: 26.5 % — ABNORMAL LOW (ref 36.0–46.0)
Hemoglobin: 8.9 g/dL — ABNORMAL LOW (ref 12.0–15.0)
MCV: 93.3 fL (ref 78.0–100.0)
WBC: 4.5 10*3/uL (ref 4.0–10.5)

## 2011-08-19 NOTE — ED Notes (Signed)
Pt states abdominal pain to LUQ. Peritoneal shunt placed 2 weeks ago. So when attempted to be drained, blood was noticed and was not able to use. Called MD and was told to come to ED. NAD. Denies pain at present. Intermittent pain. Pt states, "Drain pain when I stand up"

## 2011-08-19 NOTE — ED Notes (Signed)
Pt c/o abd pain and blood in peritoneal dialysis fluid.

## 2011-08-19 NOTE — ED Provider Notes (Signed)
History    This chart was scribed for [employer]. Colon Branch, MD, MD by Smitty Pluck. The patient was seen in room APA01 and the patient's care was started at 4:51PM.   CSN: [id number]  Arrival date & time 08/19/11  1524   First MD Initiated Contact with Patient 08/19/11 1628      Chief Complaint  Patient presents with  . Abdominal Pain    (Consider location/radiation/quality/duration/timing/severity/associated sxs/prior treatment) The history is provided by a parent.   Monica Guerra is a 26 y.o. female who presents to the Emergency Department complaining of moderate waxing and waning abdominal pain onset today. This week she is training on peritoneal dialysis and today the nurse noticed blood and referred her to ED. 3 days ago she was in the ED for abdominal pain. Tuesday and today there was blood in stomach during dialysis. She has polycystic kidney disease. She was on regular hemodialysis before she started the peritoneal dialysis. Her last hemodialysis was 1 day ago. Surgeon is Dr. Aundria Rud at [facility]. Nephrologist Dr. Fausto Skillern. PCP Dr. Phillips Odor  Past Medical History  Diagnosis Date  . ESRD on hemodialysis   . Polycystic kidney disease   . Anemia of chronic disease   . Congenital abnormalities     Orofacial digital syndrome  . Essential hypertension, benign   . History of Clostridium difficile infection   . History of bacteremia     Enterobacter cloacae 11/12    Past Surgical History  Procedure Date  . Central venous catheter insertion     Left chest  . Cleft palate repair   . Insertion of dialysis catheter 06/04/2011    Procedure: INSERTION OF DIALYSIS CATHETER;  Surgeon: Chuck Hint, MD;  Location: [facility] OR;  Service: Vascular;  Laterality: Right;  28cm dialysis catheter placed in Right Internal Jugular    No family history on file.  History  Substance Use Topics  . Smoking status: Never Smoker   . Smokeless tobacco: Not on file  . Alcohol Use: No     OB History    Grav Para Term Preterm Abortions TAB SAB Ect Mult Living                  Review of Systems  All other systems reviewed and are negative.  10 Systems reviewed and are negative for acute change except as noted in the HPI.   Allergies  Vancomycin  Home Medications   Current Outpatient Rx  Name Route Sig Dispense Refill  . ACETAMINOPHEN 500 MG PO TABS Oral Take 1,000 mg by mouth every 6 (six) hours as needed. Pain     . AMLODIPINE BESYLATE 5 MG PO TABS Oral Take 5 mg by mouth daily.      . CEFTAZIDIME 2 G IV SOLR Intravenous Inject 2 g into the vein 3 (three) times a week. 30 each 0    With hemodialysis for another 10 days  . FUROSEMIDE 20 MG PO TABS Oral Take 20 mg by mouth 2 (two) times daily.      Marland Kitchen ONDANSETRON HCL 4 MG PO TABS Oral Take 1 tablet (4 mg total) by mouth every 6 (six) hours. 12 tablet 0  . OXYCODONE-ACETAMINOPHEN 5-325 MG PO TABS Oral Take 1 tablet by mouth every 4 (four) hours as needed for pain. 15 tablet 0  . SEVELAMER CARBONATE 800 MG PO TABS Oral Take 800 mg by mouth 3 (three) times daily with meals.      BP 131/79  Pulse 75  Temp(Src) 98.5 F (36.9 C) (Oral)  SpO2 99%  LMP 07/22/2011  Physical Exam  Nursing note and vitals reviewed. Constitutional: She is oriented to person, place, and time. She appears well-developed and well-nourished. No distress.  HENT:  Head: Normocephalic and atraumatic.  Eyes: EOM are normal. Pupils are equal, round, and reactive to light.  Neck: Normal range of motion. Neck supple.  Cardiovascular: Normal rate, regular rhythm and normal heart sounds.   Pulmonary/Chest: Effort normal and breath sounds normal. No respiratory distress.        Quinton catheter on the right chest.   Abdominal: There is tenderness. There is guarding (mild). There is no rebound.       Peritoneal catheter is in place and dry  Focal left sided abdominal pain  Musculoskeletal: She exhibits edema (peripheral).  Neurological: She  is alert and oriented to person, place, and time.  Skin: Skin is warm and dry.  Psychiatric: She has a normal mood and affect. Her behavior is normal.    ED Course  Procedures (including critical care time)  DIAGNOSTIC STUDIES: Oxygen Saturation is 99% on room air, normal by my interpretation.    COORDINATION OF CARE:  4:51PM EDP discussed pt ED treatment course with pt   7:59PM EDP discuses lab results with pt. EDP recommends pt follow up with her surgeon for future treatment.  2010 Spoke with Dr. Naaman Plummer, nephrologist. Reviewed recent history and current presentation. He agrees with continued dialysis tomorrow and with surgical follow up.  Results for orders placed during the hospital encounter of 08/19/11  CBC      Component Value Range   WBC 4.5  4.0 - 10.5 (K/uL)   RBC 2.84 (*) 3.87 - 5.11 (MIL/uL)   Hemoglobin 8.9 (*) 12.0 - 15.0 (g/dL)   HCT 16.1 (*) 09.6 - 46.0 (%)   MCV 93.3  78.0 - 100.0 (fL)   MCH 31.3  26.0 - 34.0 (pg)   MCHC 33.6  30.0 - 36.0 (g/dL)   RDW 04.5  40.9 - 81.1 (%)   Platelets 193  150 - 400 (K/uL)  DIFFERENTIAL      Component Value Range   Neutrophils Relative 45  43 - 77 (%)   Neutro Abs 2.0  1.7 - 7.7 (K/uL)   Lymphocytes Relative 42  12 - 46 (%)   Lymphs Abs 1.9  0.7 - 4.0 (K/uL)   Monocytes Relative 8  3 - 12 (%)   Monocytes Absolute 0.3  0.1 - 1.0 (K/uL)   Eosinophils Relative 5  0 - 5 (%)   Eosinophils Absolute 0.2  0.0 - 0.7 (K/uL)   Basophils Relative 0  0 - 1 (%)   Basophils Absolute 0.0  0.0 - 0.1 (K/uL)  COMPREHENSIVE METABOLIC PANEL      Component Value Range   Sodium 139  135 - 145 (mEq/L)   Potassium 4.7  3.5 - 5.1 (mEq/L)   Chloride 102  96 - 112 (mEq/L)   CO2 27  19 - 32 (mEq/L)   Glucose, Bld 91  70 - 99 (mg/dL)   BUN 25 (*) 6 - 23 (mg/dL)   Creatinine, Ser 9.14 (*) 0.50 - 1.10 (mg/dL)   Calcium 78.2 (*) 8.4 - 10.5 (mg/dL)   Total Protein 6.1  6.0 - 8.3 (g/dL)   Albumin 2.9 (*) 3.5 - 5.2 (g/dL)   AST 12  0 - 37 (U/L)    ALT 6  0 - 35 (U/L)   Alkaline Phosphatase  69  39 - 117 (U/L)   Total Bilirubin 0.2 (*) 0.3 - 1.2 (mg/dL)   GFR calc non Af Amer 7 (*) >90 (mL/min)   GFR calc Af Amer 8 (*) >90 (mL/min)   Ct Abdomen Pelvis Wo Contrast  08/17/2011  *RADIOLOGY REPORT*  Clinical Data: Left upper abdominal pain.  Recent peritoneal dialysis catheter placement.  CT ABDOMEN AND PELVIS WITHOUT CONTRAST  Technique:  Multidetector CT imaging of the abdomen and pelvis was performed following the standard protocol without intravenous contrast.  Comparison: 05/14/2011  Findings: Trace left pleural effusion.  Minimal dependent atelectasis in the visualized left lower lobe.  Dialysis catheter tips extending to the right atrium.  There is a small pericardial effusion.  There is  probable left intrahepatic biliary ductal dilatation or ectasia as previously described.  Unremarkable uninfused evaluation of spleen.  Scattered coarse calcifications in the pancreatic head and body with some dilatation of the pancreatic duct as before. Multiple low attenuation bilateral renal lesions are again evident. There is a tunneled peritoneal dialysis catheter extending to the pelvis.  There is a small amount of adjacent fluid.  Urinary bladder is incompletely distended.  No free air.  Stomach and small bowel are nondilated.  The colon is unremarkable. Uterus unremarkable.  3.7 cm cystic right adnexal process.  IMPRESSION:  1.  Interval placement of peritoneal dialysis catheter to the pelvis with a small amount of adjacent peritoneal fluid, no evident complication. 2.  Stable cystic right adnexal lesion. 3.  Scattered pancreatic calcifications suggesting chronic pancreatitis. 4.  Cystic change of bilateral native kidneys as previously described. 5.  Small pericardial and left pleural effusions.  Original Report Authenticated By: Osa Craver, M.D.    MDM  Dialysis patient with recent change from hemodialysis to PD. Bleed in dwell x two dialysis  sessions resulting in stopping PD and doing HD. Seen here on 08/17/11 for abdominal pain with CT negative for acute process. Labs today unremarkable for acute change. Spoke with nephrologist who agrees with surgical follow up. Disc cope of CT made for patient to take to [facility]. Patient to receive dialysis tomorrow.Pt stable in ED with no significant deterioration in condition.The patient appears reasonably screened and/or stabilized for discharge and I doubt any other medical condition or other [facility] requiring further screening, evaluation, or treatment in the ED at this time prior to discharge.   I personally performed the services described in this documentation, which was scribed in my presence. The recorded information has been reviewed and considered.  MDM Reviewed: nursing note and vitals Reviewed previous: labs and CT scan Interpretation: labs Consults: nephrology.     Nicoletta Dress. Colon Branch, MD 08/19/11 2023

## 2011-08-19 NOTE — ED Notes (Signed)
Waiting for nephrology consult to arrive.  No expressed needs at this time.

## 2011-08-19 NOTE — ED Notes (Signed)
Pt is currently eating meal tray. NAD

## 2011-10-15 NOTE — Progress Notes (Signed)
UR Chart Review Completed  

## 2011-11-21 ENCOUNTER — Emergency Department (HOSPITAL_COMMUNITY): Payer: PRIVATE HEALTH INSURANCE

## 2011-11-21 ENCOUNTER — Emergency Department (HOSPITAL_COMMUNITY)
Admission: EM | Admit: 2011-11-21 | Discharge: 2011-11-21 | Disposition: A | Discharge status: Home / Self Care | Payer: PRIVATE HEALTH INSURANCE | Attending: Emergency Medicine | Admitting: Emergency Medicine

## 2011-11-21 ENCOUNTER — Encounter (HOSPITAL_COMMUNITY): Payer: Self-pay

## 2011-11-21 DIAGNOSIS — R55 Syncope and collapse: Secondary | ICD-10-CM | POA: Insufficient documentation

## 2011-11-21 DIAGNOSIS — Z79899 Other long term (current) drug therapy: Secondary | ICD-10-CM | POA: Insufficient documentation

## 2011-11-21 DIAGNOSIS — R296 Repeated falls: Secondary | ICD-10-CM | POA: Insufficient documentation

## 2011-11-21 DIAGNOSIS — Y92009 Unspecified place in unspecified non-institutional (private) residence as the place of occurrence of the external cause: Secondary | ICD-10-CM | POA: Insufficient documentation

## 2011-11-21 DIAGNOSIS — Z992 Dependence on renal dialysis: Secondary | ICD-10-CM | POA: Insufficient documentation

## 2011-11-21 DIAGNOSIS — R51 Headache: Secondary | ICD-10-CM | POA: Insufficient documentation

## 2011-11-21 DIAGNOSIS — S0031XA Abrasion of nose, initial encounter: Secondary | ICD-10-CM

## 2011-11-21 DIAGNOSIS — IMO0002 Reserved for concepts with insufficient information to code with codable children: Secondary | ICD-10-CM | POA: Insufficient documentation

## 2011-11-21 DIAGNOSIS — J3489 Other specified disorders of nose and nasal sinuses: Secondary | ICD-10-CM | POA: Insufficient documentation

## 2011-11-21 DIAGNOSIS — N19 Unspecified kidney failure: Secondary | ICD-10-CM | POA: Insufficient documentation

## 2011-11-21 LAB — DIFFERENTIAL
Basophils Absolute: 0 10*3/uL (ref 0.0–0.1)
Eosinophils Absolute: 0.1 10*3/uL (ref 0.0–0.7)
Eosinophils Relative: 1 % (ref 0–5)
Lymphocytes Relative: 18 % (ref 12–46)

## 2011-11-21 LAB — URINALYSIS, ROUTINE W REFLEX MICROSCOPIC
Protein, ur: 100 mg/dL — AB
Urobilinogen, UA: 0.2 mg/dL (ref 0.0–1.0)

## 2011-11-21 LAB — CBC
MCV: 97.3 fL (ref 78.0–100.0)
Platelets: 274 10*3/uL (ref 150–400)
RDW: 13.7 % (ref 11.5–15.5)
WBC: 8 10*3/uL (ref 4.0–10.5)

## 2011-11-21 LAB — URINE MICROSCOPIC-ADD ON

## 2011-11-21 LAB — BASIC METABOLIC PANEL
Calcium: 11 mg/dL — ABNORMAL HIGH (ref 8.4–10.5)
GFR calc Af Amer: 4 mL/min — ABNORMAL LOW (ref >90)
GFR calc non Af Amer: 4 mL/min — ABNORMAL LOW (ref >90)
Sodium: 135 mEq/L (ref 135–145)

## 2011-11-21 MED ORDER — TETANUS-DIPHTH-ACELL PERTUSSIS 5-2.5-18.5 LF-MCG/0.5 IM SUSP
0.5000 mL | Freq: Once | INTRAMUSCULAR | Status: AC
  Administered 2011-11-21: 0.5 mL via INTRAMUSCULAR
  Filled 2011-11-21: qty 0.5

## 2011-11-21 MED ORDER — OXYCODONE-ACETAMINOPHEN 5-325 MG PO TABS
1.0000 | ORAL_TABLET | Freq: Once | ORAL | Status: AC
  Administered 2011-11-21: 1 via ORAL
  Filled 2011-11-21: qty 1

## 2011-11-21 NOTE — ED Notes (Signed)
Pt states she was taking a shower, became dizzy and almost passed out. States she hit her head on the bathtub

## 2011-11-21 NOTE — ED Provider Notes (Signed)
History     CSN: [id number]  Arrival date & time 11/21/11  1613   First MD Initiated Contact with Patient 11/21/11 1640      Chief Complaint  Patient presents with  . Near Syncope     Patient is a 26 y.o. female presenting with syncope. The history is provided by the patient and a relative.  Loss of Consciousness This is a new problem. The current episode started 1 to 2 hours ago. The problem has been gradually improving. Associated symptoms include headaches. Pertinent negatives include no chest pain, no abdominal pain and no shortness of breath. The symptoms are aggravated by nothing. The symptoms are relieved by nothing. She has tried rest for the symptoms. The treatment provided no relief.  Pt presents after episode of LOC She was in the shower, felt dizzy and felt to ground.  She struck her face on ground and has small abrasion to nose No proceeding cp/sob/headache/weakness She now reports mild headache after fall No other injury from fall is reported  She has h/o ESRD, she performs home peritoneal dialysis.  Mother reports after this episode today she checked her BP and it was systolic 93 and it was felt to be due to increased fluid removal from PD Pt denies any fever/chills/vomiting/diarrhea/abdominal pain or focal weakness.  No other recent illnesses reported No visual change, no double vision and no epistaxis No new meds reported  Past Medical History  Diagnosis Date  . ESRD on hemodialysis   . Polycystic kidney disease   . Anemia of chronic disease   . Congenital abnormalities     Orofacial digital syndrome  . Essential hypertension, benign   . History of Clostridium difficile infection   . History of bacteremia     Enterobacter cloacae 11/12    Past Surgical History  Procedure Date  . Central venous catheter insertion     Left chest  . Cleft palate repair   . Insertion of dialysis catheter 06/04/2011    Procedure: INSERTION OF DIALYSIS CATHETER;  Surgeon:  Chuck Hint, MD;  Location: [facility] OR;  Service: Vascular;  Laterality: Right;  28cm dialysis catheter placed in Right Internal Jugular    No family history on file.  History  Substance Use Topics  . Smoking status: Never Smoker   . Smokeless tobacco: Not on file  . Alcohol Use: No    OB History    Grav Para Term Preterm Abortions TAB SAB Ect Mult Living                  Review of Systems  Respiratory: Negative for shortness of breath.   Cardiovascular: Positive for syncope. Negative for chest pain.  Gastrointestinal: Negative for abdominal pain.  Neurological: Positive for headaches.  All other systems reviewed and are negative.    Allergies  Keflet and Vancomycin  Home Medications   Current Outpatient Rx  Name Route Sig Dispense Refill  . ACETAMINOPHEN 500 MG PO TABS Oral Take 1,000 mg by mouth every 6 (six) hours as needed. Pain     . AMLODIPINE BESYLATE 5 MG PO TABS Oral Take 5 mg by mouth daily.      . CEFTAZIDIME 2 G IV SOLR Intravenous Inject 2 g into the vein 3 (three) times a week. 30 each 0    With hemodialysis for another 10 days  . FUROSEMIDE 20 MG PO TABS Oral Take 20 mg by mouth 2 (two) times daily.      Marland Kitchen SEVELAMER  CARBONATE 800 MG PO TABS Oral Take 800 mg by mouth 3 (three) times daily with meals.      BP 124/77  Pulse 79  Temp(Src) 97.5 F (36.4 C) (Oral)  Resp 18  SpO2 100%  LMP 10/10/2011  Physical Exam CONSTITUTIONAL: Well developed/well nourished HEAD AND FACE: Normocephalic/atraumatic EYES: EOMI/PERRL ENMT: Mucous membranes moist.  Small abrasion to bridge of nose with associated tenderness.  No septal hematoma.  No new dental injury noted, no trismus noted.  (pt reports previous injury to central incisors) NECK: supple no meningeal signs SPINE:entire spine nontender, NEXUS criteria met CV: S1/S2 noted, no murmurs/rubs/gallops noted LUNGS: Lungs are clear to auscultation bilaterally, no apparent distress ABDOMEN: soft, nontender,  no rebound or guarding, no peritoneal signs PD catheter noted, nontender, and no erythema/drainage from the site GU:no cva tenderness NEURO: Pt is awake/alert, moves all extremitiesx4, GCS 15, no arm/leg drift is noted EXTREMITIES: pulses normal, full ROM, no tenderness, no deformity SKIN: warm, color normal PSYCH: no abnormalities of mood noted  ED Course  Procedures   Labs Reviewed  URINALYSIS, ROUTINE W REFLEX MICROSCOPIC  CBC  DIFFERENTIAL  BASIC METABOLIC PANEL   1:19 PM Pt with h/o polycystic kid. disease, h/o tachycardia previously but had normal echo (normal EF) last winter Given she felt dizziness and felt to be underweight from peritoneal dialysis, suspicion for cardiac dysrhythmia is low I doubt pericardial effusion given history.  PE is also less likely   5:57 PM Pt feels well, no distress, ambulatory, requesting d/c home Discussed strict return precautions Will f/u with her nephrologist  The patient appears reasonably screened and/or stabilized for discharge and I doubt any other medical condition or other [facility] requiring further screening, evaluation, or treatment in the ED at this time prior to discharge.    MDM  Nursing notes reviewed and considered in documentation Previous records reviewed and considered All labs/vitals reviewed and considered       Date: 11/21/2011  Rate: 66  Rhythm: normal sinus rhythm  QRS Axis: normal  Intervals: normal  ST/T Wave abnormalities: normal  Conduction Disutrbances:none  Narrative Interpretation:   Old EKG Reviewed: unchanged     Joya Gaskins, MD 11/21/11 1758

## 2011-12-23 ENCOUNTER — Ambulatory Visit (HOSPITAL_COMMUNITY)
Admission: RE | Admit: 2011-12-23 | Discharge: 2011-12-23 | Disposition: A | Discharge status: Home / Self Care | Payer: PRIVATE HEALTH INSURANCE | Source: Ambulatory Visit | Attending: Family Medicine | Admitting: Family Medicine

## 2011-12-23 ENCOUNTER — Other Ambulatory Visit (HOSPITAL_COMMUNITY): Payer: Self-pay | Admitting: Family Medicine

## 2011-12-23 DIAGNOSIS — Z992 Dependence on renal dialysis: Secondary | ICD-10-CM | POA: Insufficient documentation

## 2011-12-23 DIAGNOSIS — R109 Unspecified abdominal pain: Secondary | ICD-10-CM | POA: Insufficient documentation

## 2011-12-24 ENCOUNTER — Other Ambulatory Visit (HOSPITAL_COMMUNITY): Payer: Self-pay | Admitting: Internal Medicine

## 2011-12-24 DIAGNOSIS — N189 Chronic kidney disease, unspecified: Secondary | ICD-10-CM

## 2011-12-24 DIAGNOSIS — R1084 Generalized abdominal pain: Secondary | ICD-10-CM

## 2011-12-24 DIAGNOSIS — R109 Unspecified abdominal pain: Secondary | ICD-10-CM

## 2011-12-28 ENCOUNTER — Ambulatory Visit (HOSPITAL_COMMUNITY): Admission: RE | Admit: 2011-12-28 | Payer: Medicare Other | Source: Ambulatory Visit

## 2012-11-16 IMAGING — CR DG ABDOMEN 1V
1 series · 1 of 1 positions shown · non-contrast
Comparison: 09/17/2011.

CLINICAL DATA: Abdominal pain.  Peroneal dialysis.

ABDOMEN - 1 VIEW

[view not recorded]
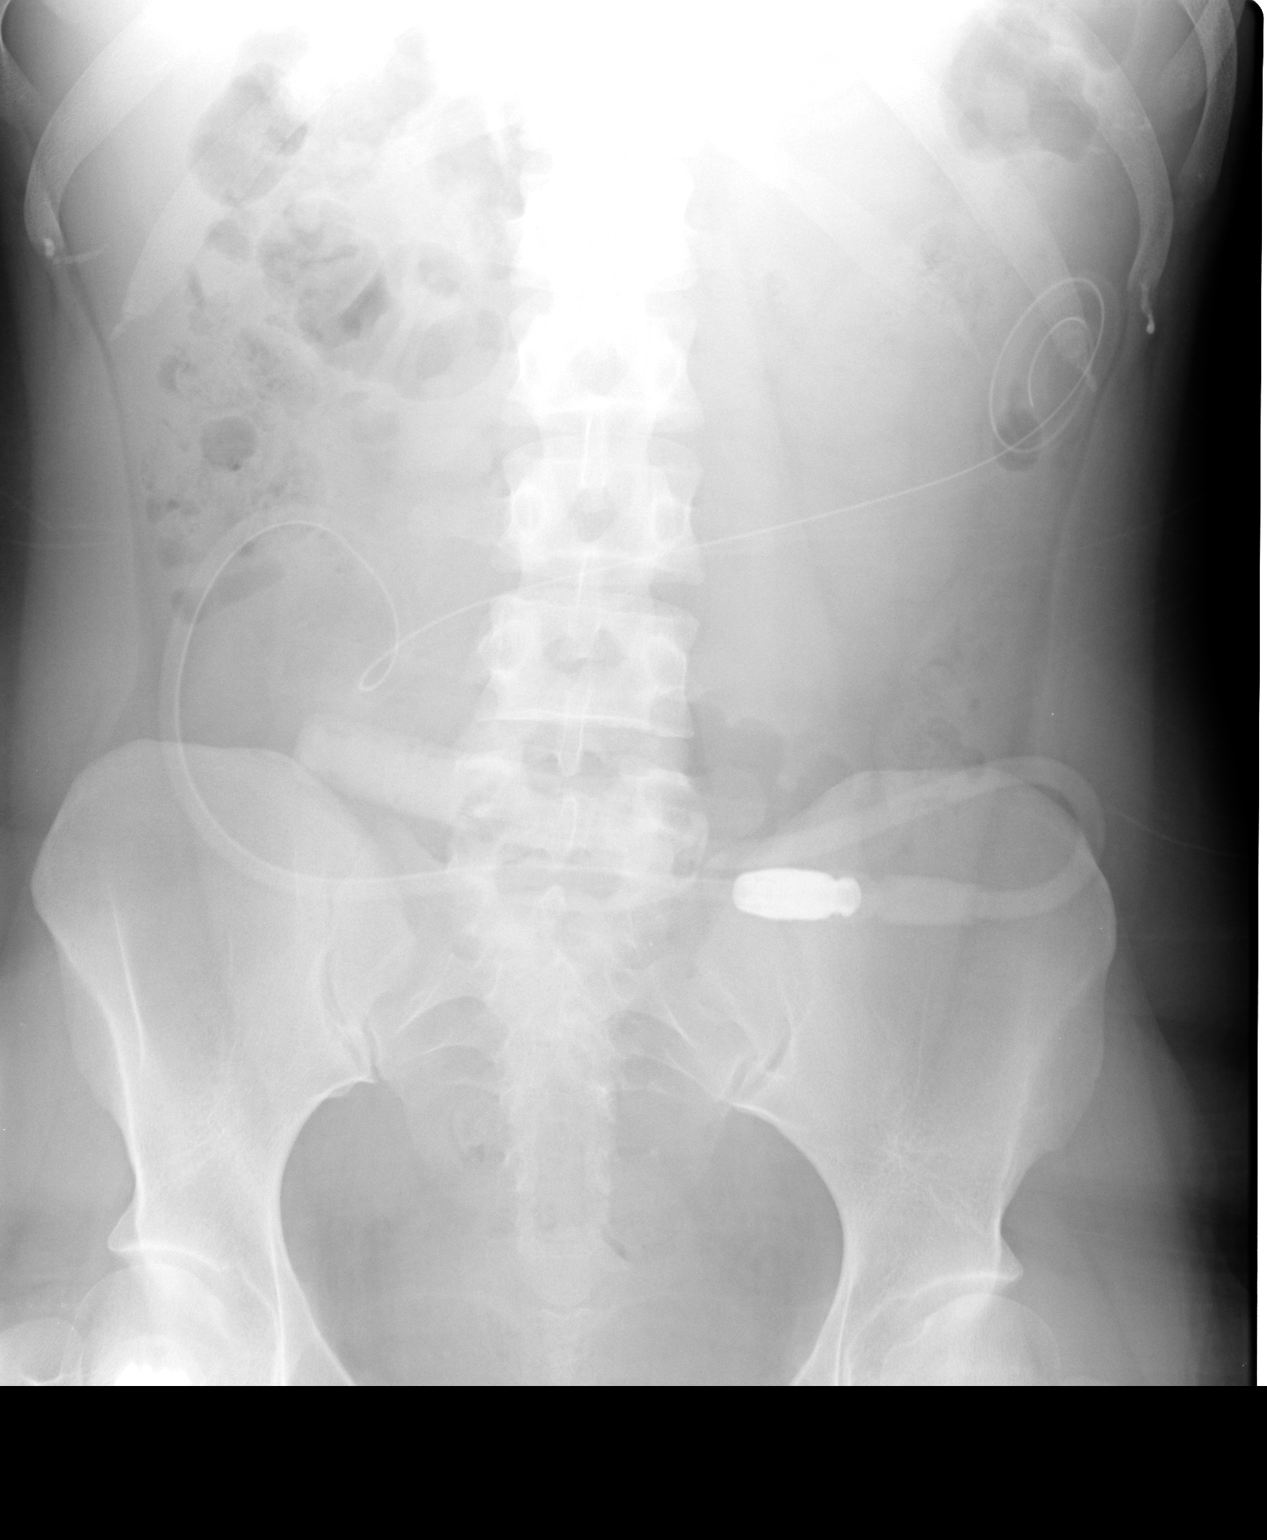

[1 of 1 positions shown; findings below may reference images not displayed]

FINDINGS: Peritoneal dialysis catheters present in the left mid
abdomen.  This represents migration compared to the prior exam of
09/17/2011.  No discontinuity.  Bowel gas pattern appears within
normal limits. Catheter likely lies in the left paracolic gutter.
IMPRESSION: Peroneal dialysis catheter in the left mid abdomen/left flank.

## 2012-11-16 IMAGING — CR DG PELVIS 1-2V
1 series · 1 of 1 positions shown · non-contrast
Comparison: None.

CLINICAL DATA: Lower abdominal pain for days.

PELVIS - 1-2 VIEW

[view not recorded]
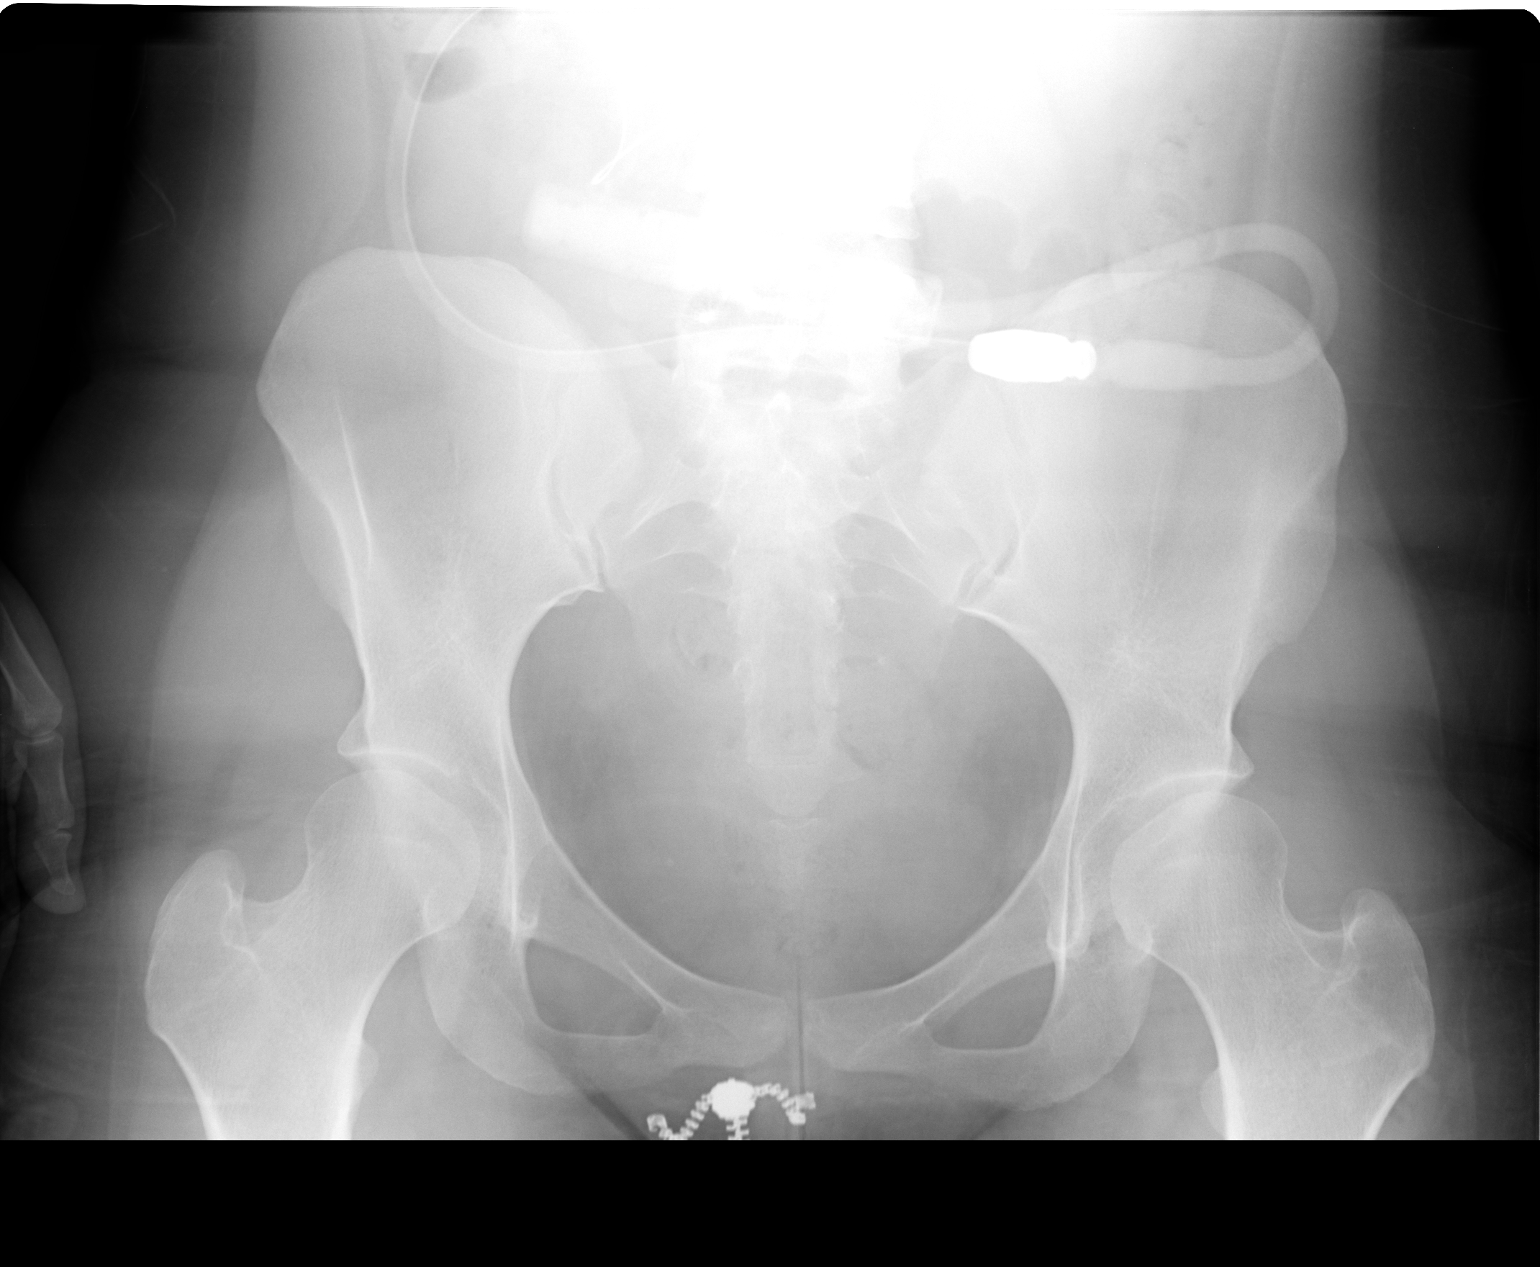

[1 of 1 positions shown; findings below may reference images not displayed]

FINDINGS: Mild left greater than right bilateral coxa valga is
present.  Pelvic rings intact.  Visualized bowel gas appears
normal.  Partial visualization of peroneal dialysis catheter.
IMPRESSION: No acute abnormality.

## 2013-10-10 ENCOUNTER — Encounter (HOSPITAL_COMMUNITY): Payer: Self-pay | Admitting: Emergency Medicine

## 2013-10-10 ENCOUNTER — Emergency Department (HOSPITAL_COMMUNITY)
Admission: EM | Admit: 2013-10-10 | Discharge: 2013-10-10 | Disposition: A | Discharge status: Home / Self Care | Payer: PRIVATE HEALTH INSURANCE | Attending: Emergency Medicine | Admitting: Emergency Medicine

## 2013-10-10 ENCOUNTER — Emergency Department (HOSPITAL_COMMUNITY): Payer: PRIVATE HEALTH INSURANCE

## 2013-10-10 DIAGNOSIS — J209 Acute bronchitis, unspecified: Secondary | ICD-10-CM | POA: Insufficient documentation

## 2013-10-10 DIAGNOSIS — Z862 Personal history of diseases of the blood and blood-forming organs and certain disorders involving the immune mechanism: Secondary | ICD-10-CM | POA: Insufficient documentation

## 2013-10-10 DIAGNOSIS — Z792 Long term (current) use of antibiotics: Secondary | ICD-10-CM | POA: Diagnosis not present

## 2013-10-10 DIAGNOSIS — Z87720 Personal history of (corrected) congenital malformations of eye: Secondary | ICD-10-CM | POA: Insufficient documentation

## 2013-10-10 DIAGNOSIS — Z79899 Other long term (current) drug therapy: Secondary | ICD-10-CM | POA: Diagnosis not present

## 2013-10-10 DIAGNOSIS — N186 End stage renal disease: Secondary | ICD-10-CM | POA: Insufficient documentation

## 2013-10-10 DIAGNOSIS — Z8619 Personal history of other infectious and parasitic diseases: Secondary | ICD-10-CM | POA: Diagnosis not present

## 2013-10-10 DIAGNOSIS — J40 Bronchitis, not specified as acute or chronic: Secondary | ICD-10-CM

## 2013-10-10 DIAGNOSIS — Z87790 Personal history of (corrected) congenital malformations of face and neck: Secondary | ICD-10-CM

## 2013-10-10 DIAGNOSIS — R05 Cough: Secondary | ICD-10-CM | POA: Diagnosis present

## 2013-10-10 DIAGNOSIS — Z992 Dependence on renal dialysis: Secondary | ICD-10-CM | POA: Insufficient documentation

## 2013-10-10 DIAGNOSIS — R1013 Epigastric pain: Secondary | ICD-10-CM | POA: Diagnosis not present

## 2013-10-10 DIAGNOSIS — I120 Hypertensive chronic kidney disease with stage 5 chronic kidney disease or end stage renal disease: Secondary | ICD-10-CM | POA: Insufficient documentation

## 2013-10-10 DIAGNOSIS — Z87721 Personal history of (corrected) congenital malformations of ear: Secondary | ICD-10-CM

## 2013-10-10 DIAGNOSIS — Z7982 Long term (current) use of aspirin: Secondary | ICD-10-CM | POA: Diagnosis not present

## 2013-10-10 DIAGNOSIS — R059 Cough, unspecified: Secondary | ICD-10-CM | POA: Diagnosis present

## 2013-10-10 LAB — CBC WITH DIFFERENTIAL/PLATELET
BASOS ABS: 0 10*3/uL (ref 0.0–0.1)
BASOS PCT: 0 % (ref 0–1)
EOS ABS: 0.1 10*3/uL (ref 0.0–0.7)
Eosinophils Relative: 2 % (ref 0–5)
HCT: 39.1 % (ref 36.0–46.0)
Hemoglobin: 13.2 g/dL (ref 12.0–15.0)
Lymphocytes Relative: 11 % — ABNORMAL LOW (ref 12–46)
Lymphs Abs: 0.6 10*3/uL — ABNORMAL LOW (ref 0.7–4.0)
MCH: 29.2 pg (ref 26.0–34.0)
MCHC: 33.8 g/dL (ref 30.0–36.0)
MCV: 86.5 fL (ref 78.0–100.0)
MONO ABS: 0.3 10*3/uL (ref 0.1–1.0)
Monocytes Relative: 6 % (ref 3–12)
NEUTROS ABS: 4.6 10*3/uL (ref 1.7–7.7)
NEUTROS PCT: 81 % — AB (ref 43–77)
Platelets: 228 10*3/uL (ref 150–400)
RBC: 4.52 MIL/uL (ref 3.87–5.11)
RDW: 13.3 % (ref 11.5–15.5)
WBC: 5.7 10*3/uL (ref 4.0–10.5)

## 2013-10-10 LAB — COMPREHENSIVE METABOLIC PANEL
ALBUMIN: 4.1 g/dL (ref 3.5–5.2)
ALT: 12 U/L (ref 0–35)
AST: 19 U/L (ref 0–37)
Alkaline Phosphatase: 99 U/L (ref 39–117)
BILIRUBIN TOTAL: 0.3 mg/dL (ref 0.3–1.2)
BUN: 21 mg/dL (ref 6–23)
CHLORIDE: 105 meq/L (ref 96–112)
CO2: 22 mEq/L (ref 19–32)
CREATININE: 0.97 mg/dL (ref 0.50–1.10)
Calcium: 10.5 mg/dL (ref 8.4–10.5)
GFR calc Af Amer: >90 mL/min (ref >90)
GFR calc non Af Amer: 81 mL/min — ABNORMAL LOW (ref >90)
Glucose, Bld: 106 mg/dL — ABNORMAL HIGH (ref 70–99)
POTASSIUM: 4 meq/L (ref 3.7–5.3)
Sodium: 140 mEq/L (ref 137–147)
TOTAL PROTEIN: 7.7 g/dL (ref 6.0–8.3)

## 2013-10-10 MED ORDER — AZITHROMYCIN 250 MG PO TABS: ORAL_TABLET | ORAL | Status: AC

## 2013-10-10 MED ORDER — OXYCODONE-ACETAMINOPHEN 5-325 MG PO TABS
1.0000 | ORAL_TABLET | Freq: Once | ORAL | Status: AC
  Administered 2013-10-10: 1 via ORAL
  Filled 2013-10-10: qty 1

## 2013-10-10 NOTE — ED Provider Notes (Signed)
CSN: [id number]     Arrival date & time 10/10/13  1626 History  This chart was scribed for Monica LennertJoseph L Glinda Natzke, MD by Beverly MilchJ Harrison Collins, ED Scribe. This patient was seen in room APA12/APA12 and the patient's care was started at 5:19 PM.    Chief Complaint  Patient presents with  . Cough  . Shortness of Breath      Patient is a 28 y.o. female presenting with cough and shortness of breath. The history is provided by the patient. No language interpreter was used.  Cough Cough characteristics:  Barking Severity:  Moderate Onset quality:  Gradual Duration:  1 week Timing:  Intermittent Progression:  Worsening Smoker: no   Relieved by:  Nothing Worsened by:  Deep breathing, lying down and activity Associated symptoms: chills and shortness of breath   Associated symptoms: no chest pain, no eye discharge, no headaches and no rash   Shortness of Breath Associated symptoms: abdominal pain and cough   Associated symptoms: no chest pain, no headaches and no rash      Past Medical History  Diagnosis Date  . ESRD on hemodialysis   . Polycystic kidney disease   . Anemia of chronic disease   . Congenital abnormalities     Orofacial digital syndrome  . Essential hypertension, benign   . History of Clostridium difficile infection   . History of bacteremia     Enterobacter cloacae 11/12    Past Surgical History  Procedure Laterality Date  . Central venous catheter insertion      Left chest  . Cleft palate repair    . Insertion of dialysis catheter  06/04/2011    Procedure: INSERTION OF DIALYSIS CATHETER;  Surgeon: Chuck Hinthristopher S Dickson, MD;  [facility];  Service: Vascular;  Laterality: Right;  28cm dialysis catheter placed in Right Internal Jugular  . Kidney transplant N/A     Added one but did not remove any    History reviewed. No pertinent family history. History  Substance Use Topics  . Smoking status: Never Smoker   . Smokeless tobacco: Not on file  . Alcohol Use: No     OB History   Grav Para Term Preterm Abortions TAB SAB Ect Mult Living                  Review of Systems  Constitutional: Positive for chills. Negative for appetite change and fatigue.  HENT: Negative for congestion, ear discharge and sinus pressure.   Eyes: Negative for discharge.  Respiratory: Positive for cough and shortness of breath.   Cardiovascular: Negative for chest pain.  Gastrointestinal: Positive for abdominal pain. Negative for diarrhea.  Genitourinary: Negative for frequency and hematuria.  Musculoskeletal: Negative for back pain.  Skin: Negative for rash.  Neurological: Negative for seizures and headaches.  Psychiatric/Behavioral: Negative for hallucinations.      Allergies  Keflet and Vancomycin  Home Medications   Current Outpatient Rx  Name  Route  Sig  Dispense  Refill  . acetaminophen (TYLENOL) 500 MG tablet   Oral   Take 1,000 mg by mouth every 6 (six) hours as needed. Pain          . aspirin 81 MG chewable tablet   Oral   Chew 81 mg by mouth at bedtime.         . magnesium oxide (MAG-OX) 400 MG tablet   Oral   Take 400 mg by mouth 2 (two) times daily. *take at least two hours apart from  Myfortic         . mycophenolate (MYFORTIC) 180 MG EC tablet   Oral   Take 720 mg by mouth 2 (two) times daily. 4 tablets twice daily         . sulfamethoxazole-trimethoprim (BACTRIM) 400-80 MG per tablet   Oral   Take 1 tablet by mouth 2 (two) times daily.         . tacrolimus (PROGRAF) 1 MG capsule   Oral   Take 2 mg by mouth 2 (two) times daily.          Triage Vitals: BP 97/58  Pulse 86  Temp(Src) 97.4 F (36.3 C) (Oral)  Resp 20  Ht 5\' 4"  (1.626 m)  Wt 175 lb (79.379 kg)  BMI 30.02 kg/m2  SpO2 100%  LMP 10/10/2013  Physical Exam  Nursing note and vitals reviewed. Constitutional: She is oriented to person, place, and time. She appears well-developed.  HENT:  Head: Normocephalic.  Eyes: Conjunctivae and EOM are normal. No  scleral icterus.  Neck: Neck supple. No thyromegaly present.  Cardiovascular: Normal rate and regular rhythm.  Exam reveals no gallop and no friction rub.   No murmur heard. Pulmonary/Chest: No stridor. She has no wheezes. She has no rales. She exhibits no tenderness.  Abdominal: She exhibits no distension. There is tenderness. There is no rebound.  Minor epigastric tenderness  Musculoskeletal: Normal range of motion. She exhibits no edema.  Lymphadenopathy:    She has no cervical adenopathy.  Neurological: She is oriented to person, place, and time. She exhibits normal muscle tone. Coordination normal.  Skin: No rash noted. No erythema.  Psychiatric: She has a normal mood and affect. Her behavior is normal.    ED Course  Procedures (including critical care time)  DIAGNOSTIC STUDIES: Oxygen Saturation is 100% on RA, normal by my interpretation.    COORDINATION OF CARE: 5:21 PM- Pt's parents advised of plan for treatment. Parents verbalize understanding and agreement with plan.     Labs Review Labs Reviewed  CBC WITH DIFFERENTIAL - Abnormal; Notable for the following:    Neutrophils Relative % 81 (*)    Lymphocytes Relative 11 (*)    Lymphs Abs 0.6 (*)    All other components within normal limits  COMPREHENSIVE METABOLIC PANEL - Abnormal; Notable for the following:    Glucose, Bld 106 (*)    GFR calc non Af Amer 81 (*)    All other components within normal limits   Imaging Review Dg Chest 2 View  10/10/2013   CLINICAL DATA:  Cough and shortness of breath.  EXAM: CHEST  2 VIEW  COMPARISON:  08/12/2011.  FINDINGS: Cardiac silhouette is normal size. Normal mediastinal and hilar contours.  Clear lungs.  No pleural effusion or pneumothorax.  The bony thorax is unremarkable.  IMPRESSION: Normal chest radiographs.   Electronically Signed   By: Amie Portland M.D.   On: 10/10/2013 17:41     EKG Interpretation None      MDM   Final diagnoses:  None      The chart was  scribed for me under my direct supervision.  I personally performed the history, physical, and medical decision making and all procedures in the evaluation of this patient.Monica Lennert, MD 10/10/13 [phone]

## 2013-10-10 NOTE — ED Notes (Signed)
States she has had "barking cough" for about a week, sob with exertion. C/o pain with breathing and coughing. Mother states pt just had kidney transplant 10months ago and [facility]

## 2013-10-10 NOTE — Discharge Instructions (Signed)
Follow up with your md next week for recheck °
